# Patient Record
Sex: Male | Born: 1990 | Race: Black or African American | Hispanic: No | Marital: Single | Smoking: Never smoker
Health system: Southern US, Community
[De-identification: ages and names within clinical notes are randomized; demographics above are authoritative.]

---
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
## 2013-06-03 ENCOUNTER — Emergency Department (HOSPITAL_COMMUNITY): Payer: 59

## 2013-06-03 ENCOUNTER — Encounter (HOSPITAL_COMMUNITY): Payer: Self-pay | Admitting: Emergency Medicine

## 2013-06-03 ENCOUNTER — Emergency Department (HOSPITAL_COMMUNITY)
Admission: EM | Admit: 2013-06-03 | Discharge: 2013-06-03 | Disposition: A | Discharge status: Home / Self Care | Payer: 59 | Attending: Emergency Medicine | Admitting: Emergency Medicine

## 2013-06-03 DIAGNOSIS — Y9361 Activity, american tackle football: Secondary | ICD-10-CM | POA: Insufficient documentation

## 2013-06-03 DIAGNOSIS — X500XXA Overexertion from strenuous movement or load, initial encounter: Secondary | ICD-10-CM | POA: Insufficient documentation

## 2013-06-03 DIAGNOSIS — Y92838 Other recreation area as the place of occurrence of the external cause: Secondary | ICD-10-CM

## 2013-06-03 DIAGNOSIS — S82409A Unspecified fracture of shaft of unspecified fibula, initial encounter for closed fracture: Secondary | ICD-10-CM | POA: Insufficient documentation

## 2013-06-03 DIAGNOSIS — Y9239 Other specified sports and athletic area as the place of occurrence of the external cause: Secondary | ICD-10-CM | POA: Insufficient documentation

## 2013-06-03 DIAGNOSIS — S82402A Unspecified fracture of shaft of left fibula, initial encounter for closed fracture: Secondary | ICD-10-CM

## 2013-06-03 MED ORDER — OXYCODONE-ACETAMINOPHEN 5-325 MG PO TABS
1.0000 | ORAL_TABLET | Freq: Once | ORAL | Status: AC
  Administered 2013-06-03: 1 via ORAL
  Filled 2013-06-03: qty 1

## 2013-06-03 MED ORDER — HYDROCODONE-ACETAMINOPHEN 5-325 MG PO TABS: 1.0000 | ORAL_TABLET | ORAL | Status: DC | PRN

## 2013-06-03 NOTE — ED Notes (Signed)
Patient reports he was playing tackle football and he was hit from behind. Patient reports, "My friend stated my knee went one way and my ankle went another." Patient states he is unable to walk on that left foot.

## 2013-06-03 NOTE — ED Provider Notes (Signed)
Medical screening examination/treatment/procedure(s) were performed by non-physician practitioner and as supervising physician I was immediately available for consultation/collaboration.   EKG Interpretation None         David H Yao, MD 06/03/13 2313 

## 2013-06-03 NOTE — ED Provider Notes (Signed)
CSN: [id number]     Arrival date & time 06/03/13  1906 History  This chart was scribed for non-physician practitioner working Johnnette Gourd, [place] with Richardean Canal, MD by Elveria Rising, ED Scribe. This patient was seen in room TR06C/TR06C and the patient's care was started at 9:35 PM.   Chief Complaint  Patient presents with  . Ankle Injury      The history is provided by the patient. No language interpreter was used.   HPI Comments: Calvin Leonard is a 23 y.o. male who presents to the Emergency Department complaining of a suspected left ankle sprain. Patient reports being tackled while playing football today and rolling his ankle. Patient currently rates pain at 10/10. States he is unable to stand or ambulate without pain. Patient has not taken any medication since the incident. Patient wears ankle braces and reports previous ankle injury.  History reviewed. No pertinent past medical history. History reviewed. No pertinent past surgical history. No family history on file. History  Substance Use Topics  . Smoking status: Never Smoker   . Smokeless tobacco: Not on file  . Alcohol Use: No    Review of Systems A complete 10 system review of systems was obtained and all systems are negative except as noted in the HPI and PMH.     Allergies  Review of patient's allergies indicates no known allergies.  Home Medications   Current Outpatient Rx  Name  Route  Sig  Dispense  Refill  . HYDROcodone-acetaminophen (NORCO/VICODIN) 5-325 MG per tablet   Oral   Take 1-2 tablets by mouth every 4 (four) hours as needed.   10 tablet   0    Triage Vitals: BP 126/57  Pulse 98  Temp(Src) 99.3 F (37.4 C) (Oral)  Resp 18  Ht 6\' 3"  (1.905 m)  Wt 210 lb (95.255 kg)  BMI 26.25 kg/m2  SpO2 99%  Physical Exam  Nursing note and vitals reviewed. Constitutional: He is oriented to person, place, and time. He appears well-developed and well-nourished. No distress.  HENT:  Head: Normocephalic and  atraumatic.  Eyes: Conjunctivae and EOM are normal.  Neck: Normal range of motion. Neck supple.  Cardiovascular: Normal rate, regular rhythm and normal heart sounds.   Pulmonary/Chest: Effort normal and breath sounds normal.  Musculoskeletal: Normal range of motion. He exhibits tenderness. He exhibits no edema.  Left ankle: Swelling and tenderness to lateral malleolus. Tenderness to talofibular ligament.  Achilles tendon intact.  +2 DP and TP pulses.   Neurological: He is alert and oriented to person, place, and time.  Skin: Skin is warm and dry.  Psychiatric: He has a normal mood and affect. His behavior is normal.    ED Course  Procedures (including critical care time) DIAGNOSTIC STUDIES: Oxygen Saturation is 99% on room air, normal by my interpretation.    COORDINATION OF CARE: 7:20 PM- Will order X-ray of left foot. Pt advised of plan for treatment and pt agrees.    Labs Review Labs Reviewed - No data to display Imaging Review Dg Ankle Complete Left  06/03/2013   CLINICAL DATA:  The bowel related injury, ankle swelling  EXAM: LEFT ANKLE COMPLETE - 3+ VIEW  COMPARISON:  None.  FINDINGS: There is an oblique nondisplaced fracture through the distal shaft of the fibula. Mortise is intact.  IMPRESSION: Fibula fracture   Electronically Signed   By: Esperanza Heir M.D.   On: 06/03/2013 21:07      EKG Interpretation None  MDM   Final diagnoses:  Left fibular fracture    Neurovascularly intact. Patient in no apparent distress. Posterior splint applied. Followup with ortho. Pain meds given. Stable for discharge. Return precautions given. Patient states understanding of treatment care plan and is agreeable.   I personally performed the services described in this documentation, which was scribed in my presence. The recorded information has been reviewed and is accurate.    Trevor MaceRobyn M Albert, PA-C 06/03/13 2136

## 2013-06-03 NOTE — Progress Notes (Signed)
Orthopedic Tech Progress Note Patient Details:  Calvin DeisDevon Leonard 12-28-1990 [id number]  Ortho Devices Type of Ortho Device: Ace wrap;Post (short leg) splint;Crutches Ortho Device/Splint Location: lle Ortho Device/Splint Interventions: Application   Pragya Lofaso 06/03/2013, 9:43 PM

## 2013-06-03 NOTE — Discharge Instructions (Signed)
Take Vicodin for severe pain only. No driving or operating heavy machinery while taking vicodin. This medication may cause drowsiness.  Fibular Fracture, Ankle, Adult, Undisplaced, Treated With Immobilization A simple fracture of the bone below the knee on the outside of your leg (fibula) usually heals without problems. CAUSES Typically, a fibular fracture occurs as a result of trauma. A blow to the side of your leg or a powerful twisting movement can cause a fracture. Fibular fractures are often seen as a result of football, soccer, or skiing injuries. SYMPTOMS Symptoms of a fibular fracture can include:  Pain.  Shortening or abnormal alignment of your lower leg (angulation). DIAGNOSIS A health care provider will need to examine the leg. X-ray exams will be ordered for further to confirm the fracture and evaluate the extent and of the injury. TREATMENT  Typically, a cast or immobilizer is applied. Sometimes a splint is placed on these fractures if it is needed for comfort or if the bones are badly out of place. Crutches may be needed to help you get around.  HOME CARE INSTRUCTIONS   Apply ice to the injured area:  Put ice in a plastic bag.  Place a towel between your skin and the bag.  Leave the ice on for 20 minutes, 2 3 times a day.  Use crutches as directed. Resume walking without crutches as directed by your health care provider or when comfortable doing so.  Only take over-the-counter or prescription medicines for pain, discomfort, or fever as directed by your health care provider.  Keeping your leg raised may lessen swelling.  If you have a removable splint or boot, do not remove the boot unless directed by your health care provider.  Do not not drive a car or operate a motor vehicle until your health care provider specifically tells you it is safe to do so. SEEK IMMEDIATE MEDICAL CARE IF:   Your cast gets damaged or breaks.  You have continued severe pain or more  swelling than you did before the cast was put on, or the pain is not controlled with medications.  Your skin or nails below the injury turn blue or grey, or feel cold or numb.  There is a bad smell or pus coming from under the cast.  You develop severe pain in ankle or foot. MAKE SURE YOU:   Understand these instructions.  Will watch your condition.  Will get help right away if you are not doing well or get worse. Document Released: 11/01/2001 Document Revised: 11/30/2012 Document Reviewed: 09/21/2012 [facility] Patient Information 2014 [place].

## 2013-06-03 NOTE — ED Notes (Signed)
Ortho paged. 

## 2015-02-27 ENCOUNTER — Telehealth: Payer: Self-pay | Admitting: Family Medicine

## 2015-02-27 NOTE — Telephone Encounter (Signed)
Pt used to see Dr. Tawanna Coolerodd back in 2012. Father called wondering if Dr. Tawanna Coolerodd would continue seeing him again. Dad, Calvin Leonard, is a regular patient of Dr. Nelida Meuseodd's. Please advise.

## 2015-02-28 NOTE — Telephone Encounter (Signed)
Dr Tawanna Coolerodd would like for patient to schedule with a different provider

## 2015-03-04 NOTE — Telephone Encounter (Signed)
Pt dad will have his son callback

## 2015-05-08 ENCOUNTER — Ambulatory Visit (INDEPENDENT_AMBULATORY_CARE_PROVIDER_SITE_OTHER): Payer: 59 | Admitting: Adult Health

## 2015-05-08 ENCOUNTER — Encounter: Payer: Self-pay | Admitting: Adult Health

## 2015-05-08 ENCOUNTER — Other Ambulatory Visit (HOSPITAL_COMMUNITY)
Admission: RE | Admit: 2015-05-08 | Discharge: 2015-05-08 | Disposition: A | Discharge status: Home / Self Care | Payer: 59 | Source: Ambulatory Visit | Attending: Adult Health | Admitting: Adult Health

## 2015-05-08 VITALS — BP 120/84 | Temp 97.9°F | Ht 75.0 in | Wt 222.9 lb

## 2015-05-08 DIAGNOSIS — M791 Myalgia: Secondary | ICD-10-CM | POA: Diagnosis not present

## 2015-05-08 DIAGNOSIS — Z113 Encounter for screening for infections with a predominantly sexual mode of transmission: Secondary | ICD-10-CM | POA: Diagnosis not present

## 2015-05-08 DIAGNOSIS — Z0001 Encounter for general adult medical examination with abnormal findings: Secondary | ICD-10-CM

## 2015-05-08 DIAGNOSIS — Z0000 Encounter for general adult medical examination without abnormal findings: Secondary | ICD-10-CM | POA: Diagnosis not present

## 2015-05-08 DIAGNOSIS — M7918 Myalgia, other site: Secondary | ICD-10-CM

## 2015-05-08 LAB — CBC WITH DIFFERENTIAL/PLATELET
BASOS ABS: 0 10*3/uL (ref 0.0–0.1)
Basophils Relative: 0.5 % (ref 0.0–3.0)
EOS ABS: 0.2 10*3/uL (ref 0.0–0.7)
Eosinophils Relative: 2.4 % (ref 0.0–5.0)
HEMATOCRIT: 38.5 % — AB (ref 39.0–52.0)
Hemoglobin: 13.4 g/dL (ref 13.0–17.0)
LYMPHS PCT: 41.4 % (ref 12.0–46.0)
Lymphs Abs: 3.1 10*3/uL (ref 0.7–4.0)
MCHC: 34.8 g/dL (ref 30.0–36.0)
MCV: 90.8 fl (ref 78.0–100.0)
MONOS PCT: 8.7 % (ref 3.0–12.0)
Monocytes Absolute: 0.6 10*3/uL (ref 0.1–1.0)
Neutro Abs: 3.5 10*3/uL (ref 1.4–7.7)
Neutrophils Relative %: 47 % (ref 43.0–77.0)
Platelets: 242 10*3/uL (ref 150.0–400.0)
RBC: 4.24 Mil/uL (ref 4.22–5.81)
RDW: 12 % (ref 11.5–15.5)
WBC: 7.4 10*3/uL (ref 4.0–10.5)

## 2015-05-08 LAB — LIPID PANEL
CHOL/HDL RATIO: 3
Cholesterol: 130 mg/dL (ref 0–200)
HDL: 38.7 mg/dL — AB (ref >39.00)
LDL CALC: 72 mg/dL (ref 0–99)
NONHDL: 90.8
Triglycerides: 96 mg/dL (ref 0.0–149.0)
VLDL: 19.2 mg/dL (ref 0.0–40.0)

## 2015-05-08 LAB — BASIC METABOLIC PANEL
BUN: 19 mg/dL (ref 6–23)
CALCIUM: 9.4 mg/dL (ref 8.4–10.5)
CO2: 28 mEq/L (ref 19–32)
Chloride: 102 mEq/L (ref 96–112)
Creatinine, Ser: 1.32 mg/dL (ref 0.40–1.50)
GFR: 86.54 mL/min (ref >60.00)
Glucose, Bld: 90 mg/dL (ref 70–99)
Potassium: 3.7 mEq/L (ref 3.5–5.1)
SODIUM: 138 meq/L (ref 135–145)

## 2015-05-08 LAB — TSH: TSH: 3.85 u[IU]/mL (ref 0.35–4.50)

## 2015-05-08 LAB — HEPATIC FUNCTION PANEL
ALK PHOS: 72 U/L (ref 39–117)
ALT: 20 U/L (ref 0–53)
AST: 36 U/L (ref 0–37)
Albumin: 4.3 g/dL (ref 3.5–5.2)
BILIRUBIN DIRECT: 0.3 mg/dL (ref 0.0–0.3)
BILIRUBIN TOTAL: 1.7 mg/dL — AB (ref 0.2–1.2)
TOTAL PROTEIN: 7 g/dL (ref 6.0–8.3)

## 2015-05-08 LAB — HEMOGLOBIN A1C: Hgb A1c MFr Bld: 4.9 % (ref 4.6–6.5)

## 2015-05-08 MED ORDER — CYCLOBENZAPRINE HCL 10 MG PO TABS: 10.0000 mg | ORAL_TABLET | Freq: Three times a day (TID) | ORAL | Status: DC | PRN

## 2015-05-08 NOTE — Progress Notes (Signed)
Patient presents to clinic today to establish care. He is a healthy and active 25 year old AA male. He has no past medical history.   Acute Concerns: Establish Care Left gluteal strain - He has been experiencing pain in his left gluteal muscle for the last 2-3 months. He woke up with the pain and it has never really gone away. He is able to work out and do deep squats. Pain is with stretching and certain twisting moments, like getting out of the car.  He has been icing and using ibuprofen  Chronic Issues: None  Health Maintenance: Dental -- Not currently seeing [facility] -- Not currently seeing. Immunizations -- UTD Diet: Eats healthy  Exercise: Works out 4-5 days a week.  History reviewed. No pertinent past medical history.  History reviewed. No pertinent past surgical history.  No current outpatient prescriptions on file prior to visit.   No current facility-administered medications on file prior to visit.    No Known Allergies  Family History  Problem Relation Age of Onset  . Elevated Lipids Father   . Hypertension Father   . Diabetes Father     Social History   Social History  . Marital Status: Single    Spouse Name: N/A  . Number of Children: N/A  . Years of Education: N/A   Occupational History  . Not on file.   Social History Main Topics  . Smoking status: Never Smoker   . Smokeless tobacco: Not on file  . Alcohol Use: No  . Drug Use: No  . Sexual Activity: Yes   Other Topics Concern  . Not on file   Social History Narrative   Bachelors in Sports Medicine.    Not married    No kids   Works at [employer]          Review of Systems  Constitutional: Negative.   HENT: Negative.   Eyes: Negative.   Respiratory: Negative.   Cardiovascular: Negative.   Gastrointestinal: Negative.   Genitourinary: Negative.   Musculoskeletal: Negative.        Pain in left gluteal   Skin: Negative.   Neurological: Negative.   Endo/Heme/Allergies:  Negative.   Psychiatric/Behavioral: Negative.     BP 120/84 mmHg  Temp(Src) 97.9 F (36.6 C) (Oral)  Ht  (1.905 m)  Wt 222 lb 14.4 oz (101.107 kg)  BMI 27.86 kg/m2  Physical Exam  Constitutional: He is oriented to person, place, and time and well-developed, well-nourished, and in no distress. No distress.  HENT:  Head: Normocephalic and atraumatic.  Right Ear: External ear normal.  Left Ear: External ear normal.  Nose: Nose normal.  Mouth/Throat: Oropharynx is clear and moist. No oropharyngeal exudate.  Eyes: Conjunctivae and EOM are normal. Pupils are equal, round, and reactive to light. Right eye exhibits no discharge. Left eye exhibits no discharge. No scleral icterus.  Neck: Normal range of motion. Neck supple. No JVD present. No tracheal deviation present. No thyromegaly present.  Cardiovascular: Normal rate, regular rhythm, normal heart sounds and intact distal pulses.  Exam reveals no gallop and no friction rub.   No murmur heard. Pulmonary/Chest: Effort normal and breath sounds normal. No respiratory distress. He has no wheezes. He has no rales. He exhibits no tenderness.  Abdominal: Soft. Bowel sounds are normal. He exhibits no distension and no mass. There is no tenderness. There is no rebound and no guarding.  Musculoskeletal: Normal range of motion. He exhibits no edema or tenderness.  Able to bend over but it causes discomfort. Raising leg straight off table causes pain.   Lymphadenopathy:    He has no cervical adenopathy.  Neurological: He is alert and oriented to person, place, and time. Gait normal. GCS score is 15.  Skin: Skin is warm and dry. No rash noted. He is not diaphoretic. No erythema. No pallor.  Psychiatric: Mood, memory, affect and judgment normal.  Nursing note and vitals reviewed.   Assessment/Plan:   1. Routine general medical examination at a health care facility - Basic metabolic panel - CBC with Differential/Platelet - Hemoglobin A1c -  Hepatic function panel - Lipid panel - TSH - Acute Hep Panel & Hep B Surface Ab - HIV antibody - HSV(herpes smplx)abs-1+2(IgG+IgM)-bld - RPR - Urine cytology ancillary only  2. Gluteal pain - Likely muscle strain or tendonitis. Doubt torn muscle.  - cyclobenzaprine (FLEXERIL) 10 MG tablet; Take 1 tablet (10 mg total) by mouth 3 (three) times daily as needed for muscle spasms.  Dispense: 30 tablet; Refill: 0 - TENS unit - Continue with ice and Ibuprofen -  Consider sports medicine follow up

## 2015-05-08 NOTE — Patient Instructions (Addendum)
It was great meeting you today!  I will follow up with you about your blood work.   Use the Flexeril as we discussed and let me know in a week how you are feeling.   If you need anything in the meantime,please let me know.   Health Maintenance, Male A healthy lifestyle and preventative care can promote health and wellness.  Maintain regular health, dental, and eye exams.  Eat a healthy diet. Foods like vegetables, fruits, whole grains, low-fat dairy products, and lean protein foods contain the nutrients you need and are low in calories. Decrease your intake of foods high in solid fats, added sugars, and salt. Get information about a proper diet from your health care provider, if necessary.  Regular physical exercise is one of the most important things you can do for your health. Most adults should get at least 150 minutes of moderate-intensity exercise (any activity that increases your heart rate and causes you to sweat) each week. In addition, most adults need muscle-strengthening exercises on 2 or more days a week.   Maintain a healthy weight. The body mass index (BMI) is a screening tool to identify possible weight problems. It provides an estimate of body fat based on height and weight. Your health care provider can find your BMI and can help you achieve or maintain a healthy weight. For males 20 years and older:  A BMI below 18.5 is considered underweight.  A BMI of 18.5 to 24.9 is normal.  A BMI of 25 to 29.9 is considered overweight.  A BMI of 30 and above is considered obese.  Maintain normal blood lipids and cholesterol by exercising and minimizing your intake of saturated fat. Eat a balanced diet with plenty of fruits and vegetables. Blood tests for lipids and cholesterol should begin at age 72 and be repeated every 5 years. If your lipid or cholesterol levels are high, you are over age 65, or you are at high risk for heart disease, you may need your cholesterol levels checked  more frequently.Ongoing high lipid and cholesterol levels should be treated with medicines if diet and exercise are not working.  If you smoke, find out from your health care provider how to quit. If you do not use tobacco, do not start.  Lung cancer screening is recommended for adults aged 55-80 years who are at high risk for developing lung cancer because of a history of smoking. A yearly low-dose CT scan of the lungs is recommended for people who have at least a 30-pack-year history of smoking and are current smokers or have quit within the past 15 years. A pack year of smoking is smoking an average of 1 pack of cigarettes a day for 1 year (for example, a 30-pack-year history of smoking could mean smoking 1 pack a day for 30 years or 2 packs a day for 15 years). Yearly screening should continue until the smoker has stopped smoking for at least 15 years. Yearly screening should be stopped for people who develop a health problem that would prevent them from having lung cancer treatment.  If you choose to drink alcohol, do not have more than 2 drinks per day. One drink is considered to be 12 oz (360 mL) of beer, 5 oz (150 mL) of wine, or 1.5 oz (45 mL) of liquor.  Avoid the use of street drugs. Do not share needles with anyone. Ask for help if you need support or instructions about stopping the use of drugs.  High  blood pressure causes heart disease and increases the risk of stroke. High blood pressure is more likely to develop in:  People who have blood pressure in the end of the normal range (100-139/85-89 mm Hg).  People who are overweight or obese.  People who are African American.  If you are 33-47 years of age, have your blood pressure checked every 3-5 years. If you are 3 years of age or older, have your blood pressure checked every year. You should have your blood pressure measured twice--once when you are at a hospital or clinic, and once when you are not at a hospital or clinic. Record  the average of the two measurements. To check your blood pressure when you are not at a hospital or clinic, you can use:  An automated blood pressure machine at a pharmacy.  A home blood pressure monitor.  If you are 24-18 years old, ask your health care provider if you should take aspirin to prevent heart disease.  Diabetes screening involves taking a blood sample to check your fasting blood sugar level. This should be done once every 3 years after age 14 if you are at a normal weight and without risk factors for diabetes. Testing should be considered at a younger age or be carried out more frequently if you are overweight and have at least 1 risk factor for diabetes.  Colorectal cancer can be detected and often prevented. Most routine colorectal cancer screening begins at the age of 8 and continues through age 43. However, your health care provider may recommend screening at an earlier age if you have risk factors for colon cancer. On a yearly basis, your health care provider may provide home test kits to check for hidden blood in the stool. A small camera at the end of a tube may be used to directly examine the colon (sigmoidoscopy or colonoscopy) to detect the earliest forms of colorectal cancer. Talk to your health care provider about this at age 24 when routine screening begins. A direct exam of the colon should be repeated every 5-10 years through age 76, unless early forms of precancerous polyps or small growths are found.  People who are at an increased risk for hepatitis B should be screened for this virus. You are considered at high risk for hepatitis B if:  You were born in a country where hepatitis B occurs often. Talk with your health care provider about which countries are considered high risk.  Your parents were born in a high-risk country and you have not received a shot to protect against hepatitis B (hepatitis B vaccine).  You have HIV or AIDS.  You use needles to inject  street drugs.  You live with, or have sex with, someone who has hepatitis B.  You are a man who has sex with other men (MSM).  You get hemodialysis treatment.  You take certain medicines for conditions like cancer, organ transplantation, and autoimmune conditions.  Hepatitis C blood testing is recommended for all people born from [birth year] through 1963 and any individual with known risk factors for hepatitis C.  Healthy men should no longer receive prostate-specific antigen (PSA) blood tests as part of routine cancer screening. Talk to your health care provider about prostate cancer screening.  Testicular cancer screening is not recommended for adolescents or adult males who have no symptoms. Screening includes self-exam, a health care provider exam, and other screening tests. Consult with your health care provider about any symptoms you have or any concerns  you have about testicular cancer.  Practice safe sex. Use condoms and avoid high-risk sexual practices to reduce the spread of sexually transmitted infections (STIs).  You should be screened for STIs, including gonorrhea and chlamydia if:  You are sexually active and are younger than 24 years.  You are older than 24 years, and your health care provider tells you that you are at risk for this type of infection.  Your sexual activity has changed since you were last screened, and you are at an increased risk for chlamydia or gonorrhea. Ask your health care provider if you are at risk.  If you are at risk of being infected with HIV, it is recommended that you take a prescription medicine daily to prevent HIV infection. This is called pre-exposure prophylaxis (PrEP). You are considered at risk if:  You are a man who has sex with other men (MSM).  You are a heterosexual man who is sexually active with multiple partners.  You take drugs by injection.  You are sexually active with a partner who has HIV.  Talk with your health care provider  about whether you are at high risk of being infected with HIV. If you choose to begin PrEP, you should first be tested for HIV. You should then be tested every 3 months for as long as you are taking PrEP.  Use sunscreen. Apply sunscreen liberally and repeatedly throughout the day. You should seek shade when your shadow is shorter than you. Protect yourself by wearing long sleeves, pants, a wide-brimmed hat, and sunglasses year round whenever you are outdoors.  Tell your health care provider of new moles or changes in moles, especially if there is a change in shape or color. Also, tell your health care provider if a mole is larger than the size of a pencil eraser.  A one-time screening for abdominal aortic aneurysm (AAA) and surgical repair of large AAAs by ultrasound is recommended for men aged 65-75 years who are current or former smokers.  Stay current with your vaccines (immunizations).   This information is not intended to replace advice given to you by your health care provider. Make sure you discuss any questions you have with your health care provider.   Document Released: 08/08/2007 Document Revised: 03/02/2014 Document Reviewed: 07/07/2010 Elsevier Interactive Patient Education [employer]2016 Elsevier Inc.

## 2015-05-09 LAB — ACUTE HEP PANEL AND HEP B SURFACE AB
HCV AB: NEGATIVE
HEP B S AG: NEGATIVE
Hep A IgM: NONREACTIVE
Hep B C IgM: NONREACTIVE
Hep B S Ab: NEGATIVE

## 2015-05-09 LAB — HIV ANTIBODY (ROUTINE TESTING W REFLEX): HIV: NONREACTIVE

## 2015-05-09 LAB — RPR

## 2015-05-09 LAB — URINE CYTOLOGY ANCILLARY ONLY
Chlamydia: NEGATIVE
NEISSERIA GONORRHEA: NEGATIVE
Trichomonas: NEGATIVE

## 2015-05-10 LAB — HSV(HERPES SMPLX)ABS-I+II(IGG+IGM)-BLD
HERPES SIMPLEX VRS I-IGM AB (EIA): 1.57 {index} — AB
HSV 1 Glycoprotein G Ab, IgG: <0.9 Index (ref <0.90)

## 2015-05-14 ENCOUNTER — Telehealth: Payer: Self-pay | Admitting: Adult Health

## 2015-05-14 NOTE — Telephone Encounter (Signed)
error 

## 2015-05-15 ENCOUNTER — Ambulatory Visit (INDEPENDENT_AMBULATORY_CARE_PROVIDER_SITE_OTHER): Payer: 59 | Admitting: Adult Health

## 2015-05-15 ENCOUNTER — Encounter: Payer: Self-pay | Admitting: Adult Health

## 2015-05-15 VITALS — BP 126/74 | Temp 97.9°F | Ht 75.0 in | Wt 222.0 lb

## 2015-05-15 DIAGNOSIS — M791 Myalgia: Secondary | ICD-10-CM | POA: Diagnosis not present

## 2015-05-15 DIAGNOSIS — M7918 Myalgia, other site: Secondary | ICD-10-CM

## 2015-05-15 MED ORDER — KETOROLAC TROMETHAMINE 60 MG/2ML IM SOLN
60.0000 mg | Freq: Once | INTRAMUSCULAR | Status: AC
  Administered 2015-05-15: 60 mg via INTRAMUSCULAR

## 2015-05-15 NOTE — Patient Instructions (Addendum)
I am sorry you are going through this still.  Continue to do stretching exercises.   Stop doing leg exercises until you are seen by Sports Medicine.   Use TENS unit. - You can buy one at [employer] pretty cheaply.     Muscle Strain A muscle strain is an injury that occurs when a muscle is stretched beyond its normal length. Usually a small number of muscle fibers are torn when this happens. Muscle strain is rated in degrees. First-degree strains have the least amount of muscle fiber tearing and pain. Second-degree and third-degree strains have increasingly more tearing and pain.  Usually, recovery from muscle strain takes 1-2 weeks. Complete healing takes 5-6 weeks.  CAUSES  Muscle strain happens when a sudden, violent force placed on a muscle stretches it too far. This may occur with lifting, sports, or a fall.  RISK FACTORS Muscle strain is especially common in athletes.  SIGNS AND SYMPTOMS At the site of the muscle strain, there may be:  Pain.  Bruising.  Swelling.  Difficulty using the muscle due to pain or lack of normal function. DIAGNOSIS  Your health care provider will perform a physical exam and ask about your medical history. TREATMENT  Often, the best treatment for a muscle strain is resting, icing, and applying cold compresses to the injured area.  HOME CARE INSTRUCTIONS   Use the PRICE method of treatment to promote muscle healing during the first 2-3 days after your injury. The PRICE method involves:  Protecting the muscle from being injured again.  Restricting your activity and resting the injured body part.  Icing your injury. To do this, put ice in a plastic bag. Place a towel between your skin and the bag. Then, apply the ice and leave it on from 15-20 minutes each hour. After the third day, switch to moist heat packs.  Apply compression to the injured area with a splint or elastic bandage. Be careful not to wrap it too tightly. This may interfere with blood  circulation or increase swelling.  Elevate the injured body part above the level of your heart as often as you can.  Only take over-the-counter or prescription medicines for pain, discomfort, or fever as directed by your health care provider.  Warming up prior to exercise helps to prevent future muscle strains. SEEK MEDICAL CARE IF:   You have increasing pain or swelling in the injured area.  You have numbness, tingling, or a significant loss of strength in the injured area. MAKE SURE YOU:   Understand these instructions.  Will watch your condition.  Will get help right away if you are not doing well or get worse.   This information is not intended to replace advice given to you by your health care provider. Make sure you discuss any questions you have with your health care provider.   Document Released: 02/09/2005 Document Revised: 11/30/2012 Document Reviewed: 09/08/2012 Elsevier Interactive Patient Education [employer]2016 Elsevier Inc.

## 2015-05-15 NOTE — Progress Notes (Signed)
   Subjective:    Patient ID: Calvin Leonard, male    DOB: Apr 05, 1990, 25 y.o.   MRN: [id number]  HPI 25 year old AA male,who returns to the office today for continued gluteal pain L>R.  I last saw him on 05/08/2015 and prescribed him Flexeril, advised stretching exercises and to use ibuprofen as needed. He reports that the Flexeril worked slightly. He stretches before and after working out.   He has not tried a TENS unit.    Review of Systems  Constitutional: Negative.   Respiratory: Negative.   Cardiovascular: Negative.   Musculoskeletal: Positive for myalgias.  Skin: Negative.   All other systems reviewed and are negative.  No past medical history on file.  Social History   Social History  . Marital Status: Single    Spouse Name: N/A  . Number of Children: N/A  . Years of Education: N/A   Occupational History  . Not on file.   Social History Main Topics  . Smoking status: Never Smoker   . Smokeless tobacco: Not on file  . Alcohol Use: No  . Drug Use: No  . Sexual Activity: Yes   Other Topics Concern  . Not on file   Social History Narrative   Bachelors in Sports Medicine.    Not married    No kids   Works at [employer]          No past surgical history on file.  Family History  Problem Relation Age of Onset  . Elevated Lipids Father   . Hypertension Father   . Diabetes Father     No Known Allergies  Current Outpatient Prescriptions on File Prior to Visit  Medication Sig Dispense Refill  . cyclobenzaprine (FLEXERIL) 10 MG tablet Take 1 tablet (10 mg total) by mouth 3 (three) times daily as needed for muscle spasms. 30 tablet 0   No current facility-administered medications on file prior to visit.    BP 126/74 mmHg  Temp(Src) 97.9 F (36.6 C) (Oral)  Ht 6\' 3"  (1.905 m)  Wt 222 lb (100.699 kg)  BMI 27.75 kg/m2       Objective:   Physical Exam  Constitutional: He is oriented to person, place, and time. He appears well-developed and  well-nourished. No distress.  Cardiovascular: Normal rate, regular rhythm, normal heart sounds and intact distal pulses.  Exam reveals no gallop and no friction rub.   No murmur heard. Pulmonary/Chest: Effort normal and breath sounds normal. No respiratory distress. He has no wheezes. He has no rales. He exhibits no tenderness.  Neurological: He is alert and oriented to person, place, and time.  Skin: Skin is warm and dry. No rash noted. He is not diaphoretic. No erythema. No pallor.  Psychiatric: He has a normal mood and affect. His behavior is normal. Judgment and thought content normal.  Nursing note and vitals reviewed.     Assessment & Plan:  1. Gluteal pain - Script written for TENS unit.  - Ambulatory referral to Sports Medicine - ketorolac (TORADOL) injection 60 mg; Inject 2 mLs (60 mg total) into the muscle once. - Stop doing leg exercises until seen by sports medicine.  - Continue with stretching exercises.

## 2015-05-20 ENCOUNTER — Ambulatory Visit (INDEPENDENT_AMBULATORY_CARE_PROVIDER_SITE_OTHER): Payer: 59 | Admitting: Family Medicine

## 2015-05-20 ENCOUNTER — Encounter: Payer: Self-pay | Admitting: Family Medicine

## 2015-05-20 ENCOUNTER — Ambulatory Visit (INDEPENDENT_AMBULATORY_CARE_PROVIDER_SITE_OTHER)
Admission: RE | Admit: 2015-05-20 | Discharge: 2015-05-20 | Disposition: A | Discharge status: Home / Self Care | Payer: 59 | Source: Ambulatory Visit | Attending: Family Medicine | Admitting: Family Medicine

## 2015-05-20 VITALS — BP 116/80 | HR 66 | Wt 221.0 lb

## 2015-05-20 DIAGNOSIS — M629 Disorder of muscle, unspecified: Secondary | ICD-10-CM | POA: Diagnosis not present

## 2015-05-20 DIAGNOSIS — M5442 Lumbago with sciatica, left side: Secondary | ICD-10-CM | POA: Diagnosis not present

## 2015-05-20 DIAGNOSIS — M79604 Pain in right leg: Secondary | ICD-10-CM

## 2015-05-20 DIAGNOSIS — M79605 Pain in left leg: Secondary | ICD-10-CM

## 2015-05-20 DIAGNOSIS — M5441 Lumbago with sciatica, right side: Secondary | ICD-10-CM

## 2015-05-20 DIAGNOSIS — M545 Low back pain, unspecified: Secondary | ICD-10-CM | POA: Insufficient documentation

## 2015-05-20 DIAGNOSIS — M6289 Other specified disorders of muscle: Secondary | ICD-10-CM | POA: Insufficient documentation

## 2015-05-20 MED ORDER — PREDNISONE 50 MG PO TABS: 50.0000 mg | ORAL_TABLET | Freq: Every day | ORAL | Status: DC

## 2015-05-20 NOTE — Progress Notes (Signed)
[name] [address] [place], [place] [address] Phone: [phone] Subjective:    I'm seeing this patient by the request  of:  Shirline Frees, NP  CC: Low back pain  [name] Calvin Leonard is a 25 y.o. male coming in with complaint of low back pain. Patient is had this pain for quite some time but seems to be worsening. Seems to be more on the buttocks area bilaterally. Some mild tightness of the lower back. States that it seems to be worse after sitting a long amount of time or being in a flexed position. Denies any significant radiation down the legs but states that it seems to be worsening. Still able to work out fairly regularly. Patient was taken a week off to see if this would help and unfortunately has not notice any significant improvement. Patient is tried over-the-counter medications with very minimal improvement. Does not remember nature injury. Rates the severity of pain a 6 out of 10. Concerned because it does not seem to be getting better.     No past medical history on file. No history of orthopedic problems No past surgical history on file. Social History   Social History  . Marital Status: Single    Spouse Name: N/A  . Number of Children: N/A  . Years of Education: N/A   Social History Main Topics  . Smoking status: Never Smoker   . Smokeless tobacco: Not on file  . Alcohol Use: No  . Drug Use: No  . Sexual Activity: Yes   Other Topics Concern  . Not on file   Social History Narrative   Bachelors in Sports Medicine.    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 Elevated Lipids Father   . Hypertension Father   . Diabetes Father   No family history of rheumatological diseases  Past medical history, social, surgical and family history all reviewed in electronic medical record.  No pertanent information  unless stated regarding to the chief complaint.   Review of Systems: No headache, visual changes, nausea, vomiting, diarrhea, constipation, dizziness, abdominal pain, skin rash, fevers, chills, night sweats, weight loss, swollen lymph nodes, body aches, joint swelling, muscle aches, chest pain, shortness of breath, mood changes.   Objective Blood pressure 116/80, pulse 66, weight 221 lb (100.245 kg).  General: No apparent distress alert and oriented x3 mood and affect normal, dressed appropriately.  HEENT: Pupils equal, extraocular movements intact  Respiratory: Patient's speak in full sentences and does not appear short of breath  Cardiovascular: No lower extremity edema, non tender, no erythema  Skin: Warm dry intact with no signs of infection or rash on extremities or on axial skeleton.  Abdomen: Soft nontender  Neuro: Cranial nerves II through XII are intact, neurovascularly intact in all extremities with 2+ DTRs and 2+ pulses.  Lymph: No lymphadenopathy of posterior or anterior cervical chain or axillae bilaterally.  Gait normal with good balance and coordination.  MSK:  Non tender with full range of motion and good stability and symmetric strength and tone of shoulders, elbows, wrist, hip, knee and ankles bilaterally.  Back Exam:  Inspection: Unremarkable  Motion: Flexion 45 deg, Extension 15 deg with positive stork test localizing to the right side., Side Bending to 45 deg bilaterally,  Rotation to 45 deg bilaterally  SLR laying: Negative  XSLR laying: Negative  Palpable tenderness: Tenderness of the paraspinal musculature of the lumbar spine L4-L5 on the right side FABER: negative. Sensory change: Gross sensation intact to all lumbar and sacral dermatomes.  Reflexes: 2+ at both patellar tendons, 2+ at achilles tendons, Babinski's downgoing.  Strength at foot  Plantar-flexion: 5/5 Dorsi-flexion: 5/5 Eversion: 5/5 Inversion: 5/5  Leg strength  Quad: 5/5 Hamstring: 5/5 but does have  tightness bilaterally. Hip flexor: 5/5 Hip abductors: 5/5  Gait unremarkable.   Procedure note 97110; 15 minutes spent for Therapeutic exercises as stated in above notes.  This included exercises focusing on stretching, strengthening, with significant focus on eccentric aspects. Pelvic tilt/bracing instruction to focus on control of the pelvic girdle and lower abdominal muscles  Glute strengthening exercises, focusing on proper firing of the glutes without engaging the low back muscles Proper stretching techniques for maximum relief for the hamstrings, hip flexors, low back and some rotation where tolerated  Proper technique shown and discussed handout in great detail with ATC.  All questions were discussed and answered.     Impression and Recommendations:     This case required medical decision making of moderate complexity.      Note: This dictation was prepared with Dragon dictation along with smaller phrase technology. Any transcriptional errors that result from this process are unintentional.

## 2015-05-20 NOTE — Assessment & Plan Note (Signed)
Work with Event organiserathletic trainer today. We discussed icing regimen. We discussed possible compression. Patient is going to focus on the back and if no significant improvement we will advance him accordingly. Also he could be a candidate for osteopathic manipulation if x-rays are normal.

## 2015-05-20 NOTE — Patient Instructions (Signed)
Good to see you  Ice 20 minutes 2 times daily. Usually after activity and before bed. Exercises 3 times a week.  Predniosne daily for 5 days  VItamin D 2000 IU daily  Xray downstairs See me again in 7-10 days  Message for Up front OK to double book new patient slot

## 2015-05-20 NOTE — Assessment & Plan Note (Signed)
Concerned with patient having more pain with extension the patient could've a stress fracture. Possible tightness of the hamstrings could be related to more of a nerve root impingement. Patient given prednisone, muscle relaxer. We discussed icing regimen. We discussed which activities to do in which ones to avoid. Patient will come back in 7-10 days. Depending on how patient is doing we will either consider advancing patient appropriately or formal physical therapy. Any weakness of the lower extremities advancing imaging would be warranted.

## 2015-05-29 ENCOUNTER — Ambulatory Visit (INDEPENDENT_AMBULATORY_CARE_PROVIDER_SITE_OTHER): Payer: 59 | Admitting: Family Medicine

## 2015-05-29 ENCOUNTER — Encounter: Payer: Self-pay | Admitting: Family Medicine

## 2015-05-29 VITALS — BP 124/80 | HR 71 | Ht 75.0 in | Wt 220.0 lb

## 2015-05-29 DIAGNOSIS — M5417 Radiculopathy, lumbosacral region: Secondary | ICD-10-CM

## 2015-05-29 DIAGNOSIS — M5442 Lumbago with sciatica, left side: Secondary | ICD-10-CM

## 2015-05-29 DIAGNOSIS — M5441 Lumbago with sciatica, right side: Secondary | ICD-10-CM

## 2015-05-29 DIAGNOSIS — M5416 Radiculopathy, lumbar region: Secondary | ICD-10-CM

## 2015-05-29 NOTE — Assessment & Plan Note (Signed)
Patient is having more localized pain and seems to be with radicular symptoms going down the left leg. Seems to be worse in a flexed position and does have a positive straight leg test. Patient states that he is also noticing some weakness of the leg and does have 4 out of 5 strength compared to the contralateral side. At this point with the weakness I do want to get advance imaging and an MRI. We need to rule out any type of nerve root impingement or any occult fracture that could be contribute in. Patient has some mild limitation in range of motion of the back as well as on causing spondylolysis is within the differential. Patient will come back after the MRI and depending on findings this could change medical management.  Spent  25 minutes with patient face-to-face and had greater than 50% of counseling including as described above in assessment and plan.

## 2015-05-29 NOTE — Progress Notes (Signed)
Calvin Leonard D.O. [facility] [address] [place], [place] [address] Phone: [phone] Subjective:     CC: Low back pain f/u Calvin Leonard:Subjective Calvin Leonard is a 25 y.o. male coming in with complaint of low back pain. Patient was seen before and did have some lower back pain as well as some tightness of the hamstrings. Patient states that unfortunately it seems to be worsening even with the conservative therapy. Patient states anytime he seems Calvin Leonard is needing has a shooting pain going down the posterior aspect of his left leg. States that also when he is trending do a staining hamstrings stretch and even feels discomfort when he is trying to do pushups. Anytime he is standing or sitting for long amount of time he has more difficulty and it seems to be giving him some weakness when he tries to stand just on that leg. There is also numbness on the left foot that now seems to be constant.     No past medical history on file. No history of orthopedic problems No past surgical history on file. Social History   Social History  . Marital Status: Single    Spouse Name: N/A  . Number of Children: N/A  . Years of Education: N/A   Social History Main Topics  . Smoking status: Never Smoker   . Smokeless tobacco: None  . Alcohol Use: No  . Drug Use: No  . Sexual Activity: Yes   Other Topics Concern  . None   Social History Narrative   Bachelors in Sports Medicine.    Not married    No kids   Works at [employer]         No Known Allergies no known drug allergies Family History  Problem Relation Age of Onset  . Elevated Lipids Father   . Hypertension Father   . Diabetes Father   No family history of rheumatological diseases  Past medical history, social, surgical and family history all reviewed in electronic medical record.  No pertanent information unless stated regarding to the chief complaint.   Review of Systems: No headache, visual changes, nausea,  vomiting, diarrhea, constipation, dizziness, abdominal pain, skin rash, fevers, chills, night sweats, weight loss, swollen lymph nodes, body aches, joint swelling, muscle aches, chest pain, shortness of breath, mood changes.   Objective Blood pressure 124/80, pulse 71, height 6\' 3"  (1.905 m), weight 220 lb (99.791 kg), SpO2 97 %.  General: No apparent distress alert and oriented x3 mood and affect normal, dressed appropriately.  HEENT: Pupils equal, extraocular movements intact  Respiratory: Patient's speak in full sentences and does not appear short of breath  Cardiovascular: No lower extremity edema, non tender, no erythema  Skin: Warm dry intact with no signs of infection or rash on extremities or on axial skeleton.  Abdomen: Soft nontender  Neuro: Cranial nerves II through XII are intact, neurovascularly intact in all extremities with 2+ DTRs and 2+ pulses.  Lymph: No lymphadenopathy of posterior or anterior cervical chain or axillae bilaterally.  Gait normal with good balance and coordination.  MSK:  Non tender with full range of motion and good stability and symmetric strength and tone of shoulders, elbows, wrist, hip, knee and ankles bilaterally.  Back Exam:  Inspection: Unremarkable  Motion: Flexion 45 deg, Extension 15 deg with positive stork test localizing to the left side., Side Bending to 45 deg bilaterally,  Rotation to 45 deg bilaterally  SLR laying positive left side XSLR laying: Negative  Palpable  tenderness: Increasing tenderness in the L4-L5 and S1 areas on the left side. FABER: negative. Sensory change: Gross sensation intact to all lumbar and sacral dermatomes.  Reflexes: 2+ at both patellar tendons, 2+ at achilles tendons, Babinski's downgoing.  Strength at foot  Plantar-flexion: 5/5 Dorsi-flexion: 5/5 Eversion: 5/5 Inversion: 5/5  Leg strength  Patient's left leg is 4 out of 5 strength of the quadriceps and hamstring compared to 5 out of 5 strength on the  contralateral side. Mild 4+ out of 5 strength on the plantar flexion of the left foot compared to the right foot. Gait unremarkable.      Impression and Recommendations:     This case required medical decision making of moderate complexity.      Note: This dictation was prepared with Dragon dictation along with smaller phrase technology. Any transcriptional errors that result from this process are unintentional.

## 2015-05-29 NOTE — Progress Notes (Signed)
Pre visit review using our clinic review tool, if applicable. No additional management support is needed unless otherwise documented below in the visit note. 

## 2015-05-29 NOTE — Patient Instructions (Signed)
Good to see you  Sorry you are still hurting.  Lets try a MRI of your back with the symptoms you are having I want to se eyou again 1-2 days after and wqe will go over it If normal then we will need to consider labs and I will try a injection on the hamstrings.  For now, only bike until MRI is done.

## 2015-06-05 ENCOUNTER — Ambulatory Visit
Admission: RE | Admit: 2015-06-05 | Discharge: 2015-06-05 | Disposition: A | Discharge status: Home / Self Care | Payer: 59 | Source: Ambulatory Visit | Attending: Family Medicine | Admitting: Family Medicine

## 2015-06-05 ENCOUNTER — Other Ambulatory Visit: Payer: Self-pay

## 2015-06-05 ENCOUNTER — Telehealth: Payer: Self-pay

## 2015-06-05 DIAGNOSIS — M5442 Lumbago with sciatica, left side: Principal | ICD-10-CM

## 2015-06-05 DIAGNOSIS — M5441 Lumbago with sciatica, right side: Secondary | ICD-10-CM

## 2015-06-05 DIAGNOSIS — M5126 Other intervertebral disc displacement, lumbar region: Secondary | ICD-10-CM | POA: Diagnosis not present

## 2015-06-05 NOTE — Telephone Encounter (Signed)
Spoke with patient. Discussed MRI results and Dr. Michaelle CopasSmith's recommendation of trying an epidural. Patient agrees to proceed with the epidural, orders will be placed.

## 2015-06-17 ENCOUNTER — Ambulatory Visit
Admission: RE | Admit: 2015-06-17 | Discharge: 2015-06-17 | Disposition: A | Discharge status: Home / Self Care | Payer: 59 | Source: Ambulatory Visit | Attending: Family Medicine | Admitting: Family Medicine

## 2015-06-17 DIAGNOSIS — M5136 Other intervertebral disc degeneration, lumbar region: Secondary | ICD-10-CM | POA: Diagnosis not present

## 2015-06-17 DIAGNOSIS — M5442 Lumbago with sciatica, left side: Principal | ICD-10-CM

## 2015-06-17 DIAGNOSIS — M5441 Lumbago with sciatica, right side: Secondary | ICD-10-CM

## 2015-06-17 MED ORDER — METHYLPREDNISOLONE ACETATE 40 MG/ML INJ SUSP (RADIOLOG
120.0000 mg | Freq: Once | INTRAMUSCULAR | Status: AC
  Administered 2015-06-17: 120 mg via EPIDURAL

## 2015-06-17 MED ORDER — IOHEXOL 180 MG/ML  SOLN
1.0000 mL | Freq: Once | INTRAMUSCULAR | Status: AC | PRN
  Administered 2015-06-17: 1 mL via EPIDURAL

## 2015-06-17 NOTE — Discharge Instructions (Signed)

## 2015-06-27 ENCOUNTER — Ambulatory Visit (INDEPENDENT_AMBULATORY_CARE_PROVIDER_SITE_OTHER): Payer: 59 | Admitting: Family Medicine

## 2015-06-27 ENCOUNTER — Encounter: Payer: Self-pay | Admitting: Family Medicine

## 2015-06-27 VITALS — BP 122/82 | HR 72 | Wt 218.0 lb

## 2015-06-27 DIAGNOSIS — M5417 Radiculopathy, lumbosacral region: Secondary | ICD-10-CM

## 2015-06-27 DIAGNOSIS — M5416 Radiculopathy, lumbar region: Secondary | ICD-10-CM

## 2015-06-27 DIAGNOSIS — M5126 Other intervertebral disc displacement, lumbar region: Secondary | ICD-10-CM | POA: Diagnosis not present

## 2015-06-27 NOTE — Progress Notes (Signed)
[name] [address] [place], [place] [address] Phone: [phone] Subjective:     CC: Low back pain f/u [id number] Calvin Leonard is a 25 y.o. male coming in with complaint of low back pain.patient was having worsening symptoms with a positive straight leg test. Patient was sent for an MRI. MRI was apparently visualized by me and showed broad base disc herniation compressing both S1 nerve roots Patient was given an epidural steroid injection. States that he was 100% improved had 3 days. Now seems to be getting some tightness again. Has been working out regularly. Denies though as much tightness of the hamstrings as he is having previously. States that the back does seem looser as well. Continues to do the exercises fairly regularly. Not taking any medications at this time.    No past medical history on file. No history of orthopedic problems No past surgical history on file. Social History   Social History  . Marital Status: Single    Spouse Name: N/A  . Number of Children: N/A  . Years of Education: N/A   Social History Main Topics  . Smoking status: Never Smoker   . Smokeless tobacco: None  . Alcohol Use: No  . Drug Use: No  . Sexual Activity: Yes   Other Topics Concern  . None   Social History Narrative   Bachelors in Sports Medicine.    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 Elevated Lipids Father   . Hypertension Father   . Diabetes Father   No family history of rheumatological diseases  Past medical history, social, surgical and family history all reviewed in electronic medical record.  No pertanent information unless stated regarding to the chief complaint.   Review of Systems: No headache, visual changes, nausea, vomiting, diarrhea, constipation, dizziness, abdominal pain, skin rash, fevers, chills, night  sweats, weight loss, swollen lymph nodes, body aches, joint swelling, muscle aches, chest pain, shortness of breath, mood changes.   Objective Blood pressure 122/82, pulse 72, weight 218 lb (98.884 kg).  General: No apparent distress alert and oriented x3 mood and affect normal, dressed appropriately.  HEENT: Pupils equal, extraocular movements intact  Respiratory: Patient's speak in full sentences and does not appear short of breath  Cardiovascular: No lower extremity edema, non tender, no erythema  Skin: Warm dry intact with no signs of infection or rash on extremities or on axial skeleton.  Abdomen: Soft nontender  Neuro: Cranial nerves II through XII are intact, neurovascularly intact in all extremities with 2+ DTRs and 2+ pulses.  Lymph: No lymphadenopathy of posterior or anterior cervical chain or axillae bilaterally.  Gait normal with good balance and coordination.  MSK:  Non tender with full range of motion and good stability and symmetric strength and tone of shoulders, elbows, wrist, hip, knee and ankles bilaterally.  Back Exam:  Inspection: Unremarkable  Motion: Flexion 45 deg, Extension 25 deg., Side Bending to 45 deg bilaterally,  Rotation to 45 deg bilaterally  SLR negative XSLR laying: Negative  Palpable tenderness: minimal tenderness over the paraspinal musculature of the lumbar spine FABER: negative. Sensory change: Gross sensation intact to all lumbar and sacral dermatomes.  Reflexes: 2+ at both patellar tendons, 2+ at achilles tendons, Babinski's downgoing.  Strength at foot  Plantar-flexion: 5/5 Dorsi-flexion: 5/5 Eversion:  5/5 Inversion: 5/5  Leg strength  5 out of 5 and symmetric which is a improvement.  Gait unremarkable.      Impression and Recommendations:     This case required medical decision making of moderate complexity.      Note: This dictation was prepared with Dragon dictation along with smaller phrase technology. Any transcriptional errors  that result from this process are unintentional.

## 2015-06-27 NOTE — Assessment & Plan Note (Signed)
Patient responded well to the injection. Tolerated the procedure well. We discussed icing regimen and home exercises. Patient was given an exercise prescription today and will be sent to formal physical therapy. Discuss continuing to monitor for any type of symptoms in which red flags and when to seek medical attention. Follow-up again in 4-6 weeks.  Spent  25 minutes with patient face-to-face and had greater than 50% of counseling including as described above in assessment and plan.

## 2015-06-27 NOTE — Patient Instructions (Signed)
I am glad you are doing better Ice is still good  Physical therapy will be calling you  Stay active but be careful with heavy lifting.  I would start 50% weight and increase 10% a week See me again in 4-6 weeks to make sure you are doing better and if not then we will consider one more injection.

## 2015-07-02 ENCOUNTER — Ambulatory Visit: Payer: 59 | Attending: Family Medicine | Admitting: Physical Therapy

## 2015-07-02 ENCOUNTER — Encounter: Payer: Self-pay | Admitting: Physical Therapy

## 2015-07-02 DIAGNOSIS — M545 Low back pain: Secondary | ICD-10-CM

## 2015-07-02 DIAGNOSIS — M25552 Pain in left hip: Secondary | ICD-10-CM

## 2015-07-02 NOTE — Therapy (Signed)
[facility] [address] [place], [place], [address] Phone: [phone]   Fax:  [phone]  Physical Therapy Evaluation  Patient Details  Name: Calvin Leonard MRN: [id number] Date of Birth: 1990-11-15 Referring Provider: Antoine PrimasZachary Smith, DO  Encounter Date: 07/02/2015      PT End of Session - 07/02/15 0932    Visit Number 1   Number of Visits 9   Date for PT Re-Evaluation 08/02/15   Authorization Type [facility] employee   PT Start Time 0930   PT Stop Time 1017   PT Time Calculation (min) 47 min      History reviewed. No pertinent past medical history.  History reviewed. No pertinent past surgical history.  There were no vitals filed for this visit.       Subjective Assessment - 07/02/15 0933    Subjective approx 3 months began feeling back pain, suspected from working out. At first thought it was a L leg injury. Currently working out at gym.    Limitations Sitting   How long can you sit comfortably? depending on the day 5-10 min or hours   How long can you stand comfortably? no issues   Diagnostic tests MRI    Patient Stated Goals able to sit without pain   Currently in Pain? Yes   Pain Score 3    Pain Location Back   Pain Orientation Left   Pain Descriptors / Indicators Aching   Pain Type Acute pain   Pain Radiating Towards L buttock   Pain Onset 1 to 4 weeks ago   Pain Frequency Intermittent   Aggravating Factors  being seated   Pain Relieving Factors standing and moving around   Multiple Pain Sites Yes   Pain Score 8   Pain Location Buttocks   Pain Orientation Left   Pain Descriptors / Indicators Aching   Pain Type Acute pain   Pain Radiating Towards LLE   Pain Onset 1 to 4 weeks ago   Pain Frequency Intermittent   Aggravating Factors  sitting   Pain Relieving Factors being active            [facility] PT Assessment - 07/02/15 0001    Assessment   Medical Diagnosis L5-S1 disk herniation   Referring  Provider Antoine PrimasZachary Smith, DO   Onset Date/Surgical Date 06/12/15  approximately   Hand Dominance Right   Next MD Visit none at this time   Prior Therapy no   Precautions   Precautions None   Restrictions   Weight Bearing Restrictions No   Balance Screen   Has the patient fallen in the past 6 months No   Prior Function   Vocation Full time employment   Vocation Requirements cleaning pt rooms, trash pick up   Observation/Other Assessments   Focus on Therapeutic Outcomes (FOTO)  25% disability   ROM / Strength   AROM / PROM / Strength AROM;Strength   AROM   AROM Assessment Site Lumbar   Lumbar Flexion 110   Lumbar Extension 30   Strength   Strength Assessment Site Hip   Right/Left Hip Right;Left   Right Hip Flexion 4/5   Right Hip Extension 5/5   Left Hip Flexion 4/5   Left Hip Extension 5/5                   [facility] Adult PT Treatment/Exercise - 07/02/15 0001    Self-Care   Self-Care Other Self-Care Comments   Other Self-Care Comments  use of  gym equipment with proper posture   Exercises   Exercises Lumbar   Lumbar Exercises: Supine   Ab Set 10 reps;5 seconds   Lumbar Exercises: Quadruped   Opposite Arm/Leg Raise 10 reps;3 seconds   Modalities   Modalities Moist Heat   Moist Heat Therapy   Number Minutes Moist Heat 10 Minutes   Moist Heat Location Lumbar Spine  in prone                PT Education - 07/02/15 1314    Education provided Yes   Education Details anatomy of condition, POC, HEP, proper posture with gym equipment, squat form, core contraction, MRI review.    Person(s) Educated Patient   Methods Explanation;Demonstration;Tactile cues;Verbal cues   Comprehension Verbalized understanding;Returned demonstration;Verbal cues required;Tactile cues required;Need further instruction             PT Long Term Goals - 07/02/15 1326    PT LONG TERM GOAL #1   Title resolution of radicular pain in seated position. by 6/9   Time 4   Period  Weeks   Status New   PT LONG TERM GOAL #2   Title FOTO to 84% ability.    Time 4   Period Weeks   Status New   PT LONG TERM GOAL #3   Title able to perform independent exercise program and work duties without LBP or radicular pain   Time 4   Period Weeks   Status New   PT LONG TERM GOAL #4   Title hip flexor MMT 5/5 without compensation from adductors.    Time 4   Period Weeks   Status New               Plan - 07/02/15 1319    Clinical Impression Statement Pt c/o signs and symptoms consistent with disk herniation resulting in radicular pain into L buttock. Pt symptoms are position-dependent, improved with standing and extension and worsened with bending and slouching in seated. Notable hypertrophy in lumbar paraspinals with difficulty appropriately contracting core. Discussed gym posture and holding weight in front of him rather than on shoulders while squatting. pt will benefit from skilled PT in order to improve posture and awareness while in seated and during work and gym activities.  Pt will also benefit from coordination training of gluts and core to decrease overuse of adductor musculature.    Rehab Potential Good   PT Frequency 2x / week   PT Duration 4 weeks   PT Treatment/Interventions ADLs/Self Care Home Management;Cryotherapy;Electrical Stimulation;Traction;Ultrasound;Moist Heat;Iontophoresis /ml Dexamethasone;Gait training;Stair training;Functional mobility training;Therapeutic activities;Therapeutic exercise;Balance training;Patient/family education;Neuromuscular re-education;Manual techniques;Taping;Dry needling;Passive range of motion   PT Next Visit Plan traction if continued radicular pain, postural extensor training, core and glut coordination   PT Home Exercise Plan transverse abdominis, bird dog   Consulted and Agree with Plan of Care Patient      Patient will benefit from skilled therapeutic intervention in order to improve the following deficits and  impairments:  Pain, Improper body mechanics, Increased muscle spasms, Decreased coordination  Visit Diagnosis: Left low back pain, with sciatica presence unspecified - Plan: PT plan of care cert/re-cert  Pain in left hip - Plan: PT plan of care cert/re-cert     Problem List Patient Active Problem List   Diagnosis Date Noted  . 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 Low back pain 05/20/2015  . Hamstring tightness 05/20/2015  . Preventative health care 09/11/2010  . LEG PAIN,  BILATERAL 04/11/2009  . HIP PAIN, LEFT 12/27/2007    Shaunita Seney C. Malasha Kleppe PT, DPT 07/02/2015 1:34 PM   [facility] [address] [place], [place], [address] Phone: [phone]   Fax:  [phone]  Name: Calvin Leonard MRN: [id number] Date of Birth: Feb 03, 1991

## 2015-07-10 ENCOUNTER — Ambulatory Visit: Payer: 59 | Admitting: Physical Therapy

## 2015-07-10 ENCOUNTER — Encounter: Payer: Self-pay | Admitting: Physical Therapy

## 2015-07-10 DIAGNOSIS — M545 Low back pain: Secondary | ICD-10-CM | POA: Diagnosis not present

## 2015-07-10 DIAGNOSIS — M25552 Pain in left hip: Secondary | ICD-10-CM | POA: Diagnosis not present

## 2015-07-10 NOTE — Therapy (Signed)
[facility] [address] [place], [place], [address] Phone: [phone]   Fax:  [phone]  Physical Therapy Treatment  Patient Details  Name: Calvin Leonard MRN: [id number] Date of Birth: 1990-06-05 Referring Provider: Antoine Primas, DO  Encounter Date: 07/10/2015      PT End of Session - 07/10/15 1238    Visit Number 2   Number of Visits 9   Date for PT Re-Evaluation 08/02/15   Authorization Type [facility] employee   PT Start Time 1146   PT Stop Time 1235   PT Time Calculation (min) 49 min   Activity Tolerance Patient tolerated treatment well   Behavior During Therapy [facility] for tasks assessed/performed      History reviewed. No pertinent past medical history.  History reviewed. No pertinent past surgical history.  There were no vitals filed for this visit.      Subjective Assessment - 07/10/15 1233    Subjective Pt reports he has had significant improvements in pain. Is able to stand from a chair without radicular pain. Continues to have sharp incidence of pain occasionally but is less constant.    Currently in Pain? Yes   Pain Score 3    Pain Location Back   Pain Orientation Lower   Pain Descriptors / Indicators Aching;Tightness   Pain Type Acute pain                         [facility] Adult PT Treatment/Exercise - 07/10/15 0001    Lumbar Exercises: Stretches   Active Hamstring Stretch Other (comment)  15 ea   Lumbar Exercises: Standing   Other Standing Lumbar Exercises kneeling chop 5lb x20 ea   Other Standing Lumbar Exercises knee driver with eccentric lower x10 ea   Lumbar Exercises: Seated   Sit to Stand Limitations x10 eccentric   Lumbar Exercises: Prone   Other Prone Lumbar Exercises prone press ups x15   Other Prone Lumbar Exercises swimmers 10x each   Moist Heat Therapy   Number Minutes Moist Heat 10 Minutes   Moist Heat Location Lumbar Spine  prone   Manual Therapy   Manual therapy  comments IASTM L lumbar paraspinals and QL                PT Education - 07/10/15 1237    Education provided Yes   Education Details exercise form/rationale, extensions during workouts as precautionary measure.    Person(s) Educated Patient   Methods Explanation;Demonstration;Tactile cues;Verbal cues   Comprehension Verbalized understanding;Returned demonstration;Verbal cues required;Tactile cues required;Need further instruction             PT Long Term Goals - 07/02/15 1326    PT LONG TERM GOAL #1   Title resolution of radicular pain in seated position. by 6/9   Time 4   Period Weeks   Status New   PT LONG TERM GOAL #2   Title FOTO to 84% ability.    Time 4   Period Weeks   Status New   PT LONG TERM GOAL #3   Title able to perform independent exercise program and work duties without LBP or radicular pain   Time 4   Period Weeks   Status New   PT LONG TERM GOAL #4   Title hip flexor MMT 5/5 without compensation from adductors.    Time 4   Period Weeks   Status New  Plan - 07/10/15 1239    Clinical Impression Statement Challenged eccentric control of lower extremities today as well as extension stabilization endurance today. Denied radicular pain until beginning active hamstring stretch followed by bridges wehre he felt pain in his lower buttock; resolved with neural glides in supine and prone press ups.    PT Treatment/Interventions ADLs/Self Care Home Management;Cryotherapy;Electrical Stimulation;Traction;Ultrasound;Moist Heat;Iontophoresis 4mg /ml Dexamethasone;Gait training;Stair training;Functional mobility training;Therapeutic activities;Therapeutic exercise;Balance training;Patient/family education;Neuromuscular re-education;Manual techniques;Taping;Dry needling;Passive range of motion   PT Next Visit Plan traction if continued radicular pain, postural extensor training, core and glut coordination   PT Home Exercise Plan transverse  abdominis, bird dog   Consulted and Agree with Plan of Care Patient      Patient will benefit from skilled therapeutic intervention in order to improve the following deficits and impairments:  Pain, Improper body mechanics, Increased muscle spasms, Decreased coordination  Visit Diagnosis: Left low back pain, with sciatica presence unspecified  Pain in left hip     Problem List Patient Active Problem List   Diagnosis Date Noted  . 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 Low back pain 05/20/2015  . Hamstring tightness 05/20/2015  . Preventative health care 09/11/2010  . LEG PAIN, BILATERAL 04/11/2009  . HIP PAIN, LEFT 12/27/2007   Cliffton Spradley C. Aahil Fredin PT, DPT 07/10/2015 12:43 PM   [facility] [address] [place], [place], [address] Phone: [phone]   Fax:  [phone]  Name: Calvin Leonard MRN: [id number] Date of Birth: 04/10/1990

## 2015-07-11 ENCOUNTER — Ambulatory Visit: Payer: 59 | Admitting: Physical Therapy

## 2015-07-11 ENCOUNTER — Encounter: Payer: Self-pay | Admitting: Physical Therapy

## 2015-07-11 DIAGNOSIS — M545 Low back pain: Secondary | ICD-10-CM

## 2015-07-11 DIAGNOSIS — M25552 Pain in left hip: Secondary | ICD-10-CM | POA: Diagnosis not present

## 2015-07-11 NOTE — Therapy (Addendum)
[place], [place], [address] Phone: [phone]   Fax:  [phone]  Physical Therapy Treatment/Discharge  Patient Details  Name: Calvin Leonard MRN: [id number] Date of Birth: April 23, 1990 Referring Provider: Hulan Saas, DO  Encounter Date: 07/11/2015      PT End of Session - 07/11/15 1059    Visit Number 3   Number of Visits 9   Date for PT Re-Evaluation 08/02/15   Authorization Type [facility] employee   PT Start Time 1100   PT Stop Time 6811   PT Time Calculation (min) 49 min      History reviewed. No pertinent past medical history.  History reviewed. No pertinent past surgical history.  There were no vitals filed for this visit.      Subjective Assessment - 07/11/15 1102    Subjective Denies pain following last visit. Planning to try running today. Mild tightness in lower back.    Limitations Sitting   How long can you sit comfortably? unlimited.    Currently in Pain? No/denies                         [facility] Adult PT Treatment/Exercise - 07/11/15 0001    Lumbar Exercises: Stretches   Active Hamstring Stretch Other (comment)  15 ea   Passive Hamstring Stretch Limitations slant board stretch 2x30s   Quad Stretch Limitations prone 2x30s ea   ITB Stretch Limitations 30s ea   Lumbar Exercises: Standing   Other Standing Lumbar Exercises kneeling chop GTB, high kneeling GHJ ext.   Other Standing Lumbar Exercises knee driver with eccentric lower x15 ea   Lumbar Exercises: Supine   Other Supine Lumbar Exercises hooklying SLR with slight hip IR   Lumbar Exercises: Prone   Opposite Arm/Leg Raise 10 reps;5 seconds   Other Prone Lumbar Exercises high planks with arm lift   Lumbar Exercises: Quadruped   Straight Leg Raises Limitations hip extension with knee flexed to 90                PT Education - 07/11/15 1239    Education provided Yes   Education Details exercise  form/rationale, pilates.    Person(s) Educated Patient   Methods Explanation;Demonstration;Tactile cues;Verbal cues   Comprehension Verbalized understanding;Returned demonstration;Verbal cues required;Tactile cues required;Need further instruction             PT Long Term Goals - 07/02/15 1326    PT LONG TERM GOAL #1   Title resolution of radicular pain in seated position. by 6/9   Time 4   Period Weeks   Status New   PT LONG TERM GOAL #2   Title FOTO to 84% ability.    Time 4   Period Weeks   Status New   PT LONG TERM GOAL #3   Title able to perform independent exercise program and work duties without LBP or radicular pain   Time 4   Period Weeks   Status New   PT LONG TERM GOAL #4   Title hip flexor MMT 5/5 without compensation from adductors.    Time 4   Period Weeks   Status New               Plan - 07/11/15 1240    Clinical Impression Statement Difficulty with core control in rotational motions today. unable to remain balanced in plank when removing one of 4 BOS.    PT Next Visit Plan reformer with  Jen if schedule allows   PT Home Exercise Plan transverse abdominis, bird dog   Consulted and Agree with Plan of Care Patient      Patient will benefit from skilled therapeutic intervention in order to improve the following deficits and impairments:  Pain, Improper body mechanics, Increased muscle spasms, Decreased coordination  Visit Diagnosis: Pain in left hip  Left low back pain, with sciatica presence unspecified     Problem List Patient Active Problem List   Diagnosis Date Noted  . 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 Low back pain 05/20/2015  . Hamstring tightness 05/20/2015  . Preventative health care 09/11/2010  . LEG PAIN, BILATERAL 04/11/2009  . HIP PAIN, LEFT 12/27/2007   PHYSICAL THERAPY DISCHARGE SUMMARY  Visits from Start of Care: 3 Current functional level related to goals / functional  outcomes: See above   Remaining deficits: See above   Education / Equipment: Gym program, HEP, exercise form/rationale  Plan: Patient agrees to discharge.  Patient goals were not met. Patient is being discharged due to the patient's request.  ?????        Pt called to request discharge from PT, unable to measure goal progress but pt is pleased with progress.    Ermalinda Joubert C. Evonna Stoltz PT, DPT 07/11/2015 12:47 PM   [facility] [address] [place], [place], [address] Phone: [phone]   Fax:  [phone]  Name: Calvin Leonard MRN: [id number] Date of Birth: 1990-10-07

## 2015-07-16 ENCOUNTER — Ambulatory Visit: Payer: 59 | Admitting: Physical Therapy

## 2015-07-18 ENCOUNTER — Ambulatory Visit: Payer: 59 | Admitting: Physical Therapy

## 2015-07-23 ENCOUNTER — Encounter: Payer: 59 | Admitting: Physical Therapy

## 2015-07-25 ENCOUNTER — Encounter: Payer: 59 | Admitting: Physical Therapy

## 2015-07-30 ENCOUNTER — Encounter: Payer: 59 | Admitting: Physical Therapy

## 2015-08-01 ENCOUNTER — Encounter: Payer: 59 | Admitting: Physical Therapy

## 2017-03-21 IMAGING — DX DG LUMBAR SPINE COMPLETE 4+V
5 series · 5 of 5 positions shown · non-contrast
Comparison: None.

CLINICAL DATA: Low back pain, bilateral leg pain intermittent for 2
months. No known injury.

EXAM:
LUMBAR SPINE - COMPLETE 4+ VIEW

[l-spine ap]
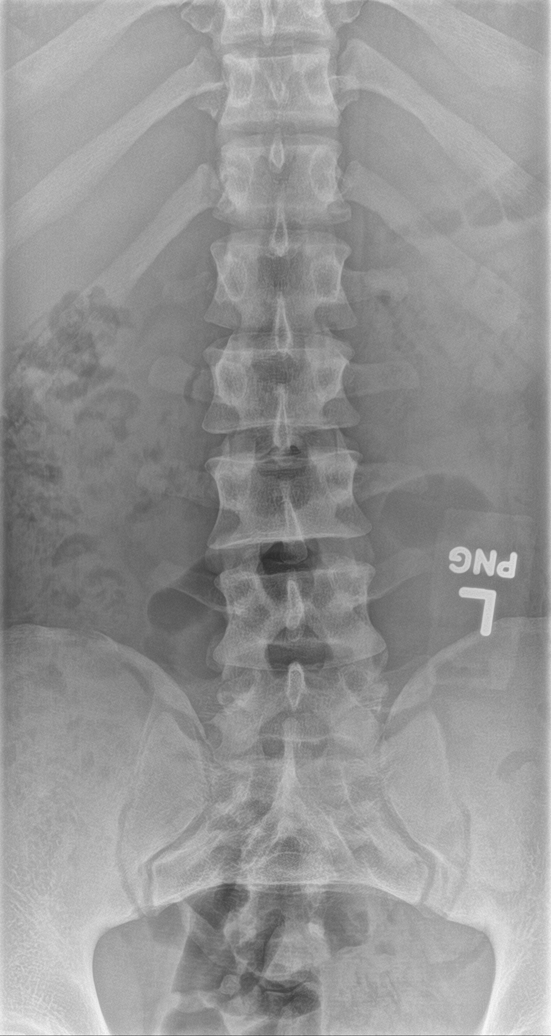

[l-spine obl (1 of 2)]
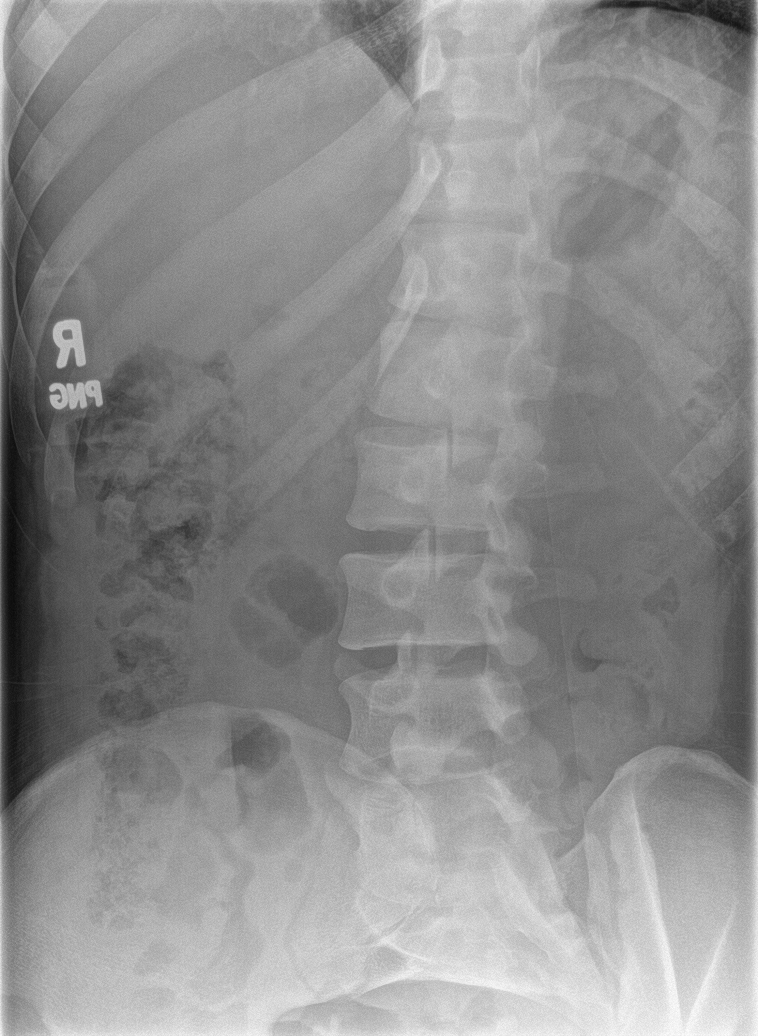

[l-spine obl (2 of 2)]
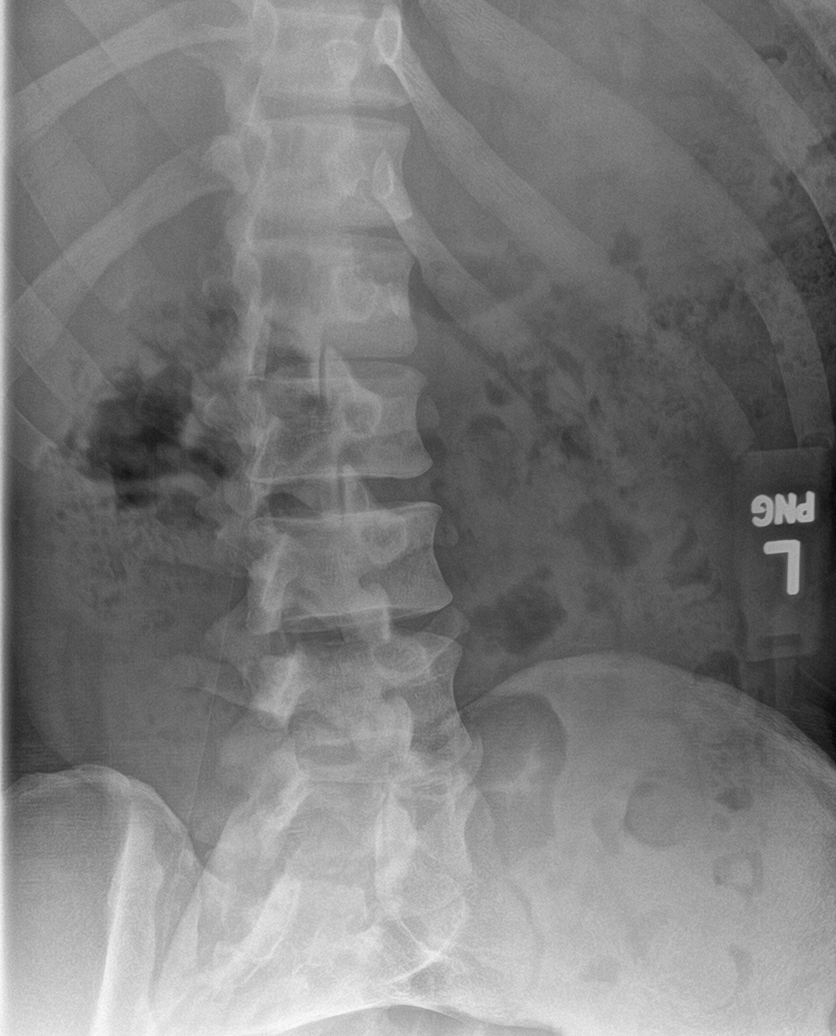

[l-spine lat]
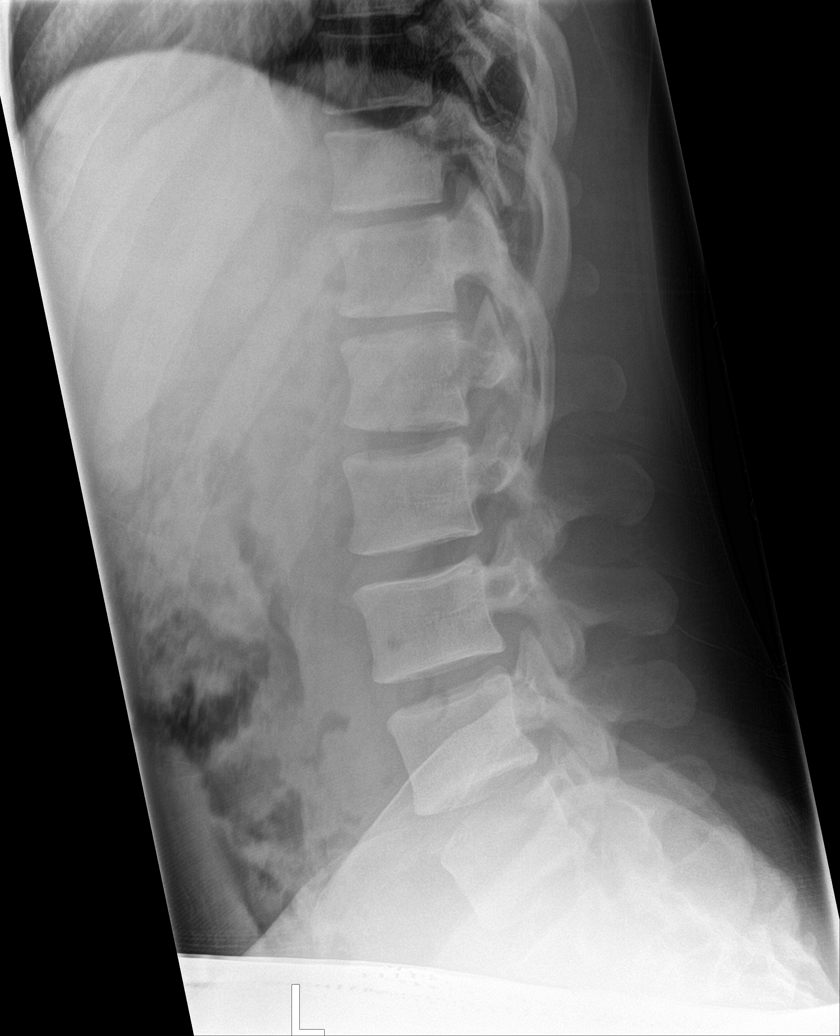

[l-spine spot]
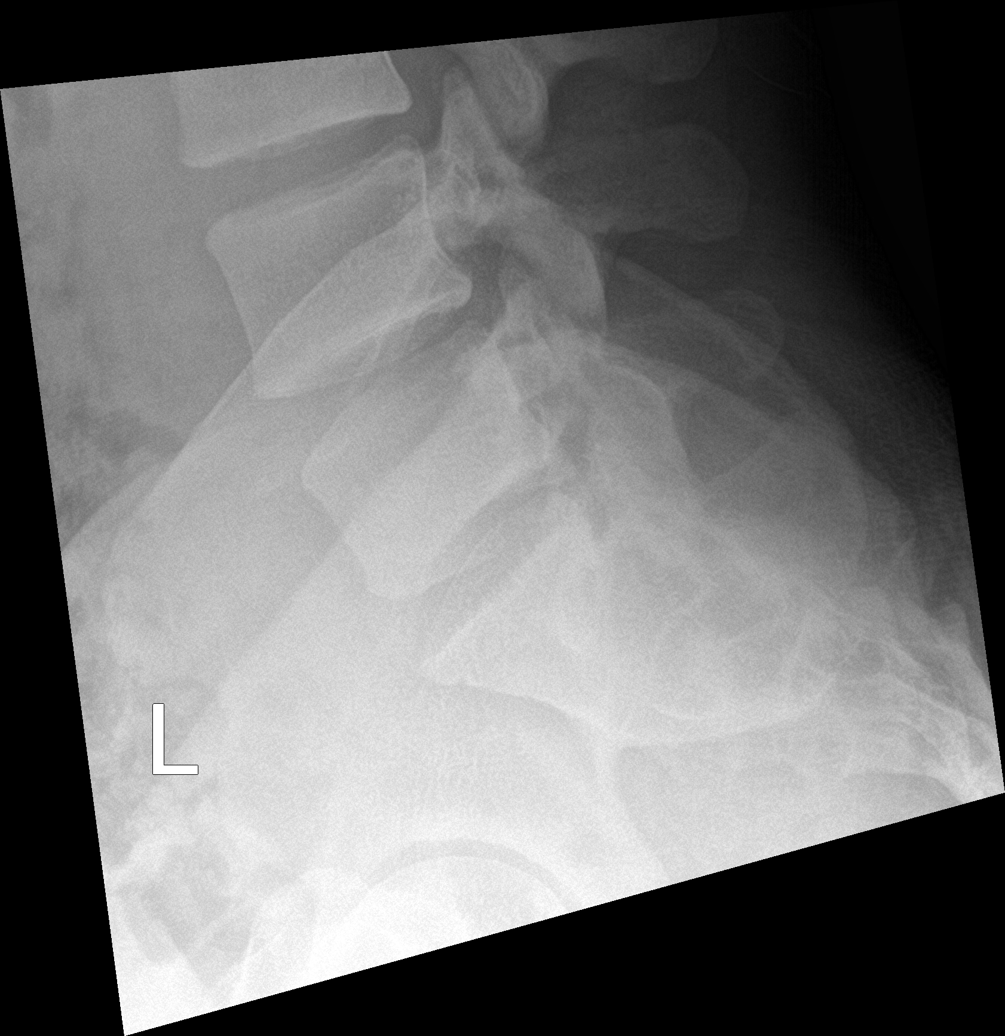

[5 of 5 positions shown; findings below may reference images not displayed]

FINDINGS: There is no evidence of lumbar spine fracture. Alignment is normal.
Intervertebral disc spaces are maintained.
IMPRESSION: Negative.

## 2018-12-15 ENCOUNTER — Telehealth: Payer: Self-pay

## 2018-12-15 NOTE — Telephone Encounter (Signed)
Copied from [place] [id number]. Topic: Appointment Scheduling - Scheduling Inquiry for Clinic >> Dec 15, 2018 11:14 AM Lennox Solders wrote: Reason for CRM: pt last had cpe in 2017.pt would like a cpe before dec 2020

## 2018-12-15 NOTE — Telephone Encounter (Signed)
Please advise. There is no availability until January 2021. Can the patient be worked in?

## 2018-12-16 NOTE — Telephone Encounter (Signed)
At this time there is no availability.

## 2018-12-29 ENCOUNTER — Telehealth: Payer: Self-pay | Admitting: Adult Health

## 2018-12-29 NOTE — Telephone Encounter (Signed)
Patient is needing Dr Tommi Rumps to fill out FMLA paperwrk . Please advise

## 2018-12-29 NOTE — Telephone Encounter (Signed)
Spoke to [place].  He is requesting Tommi Rumps fill out FMLA forms for depression and anxiety.  Advised that he will need an office visit to be evaluated for FMLA.  Pt agreed and scheduled.  Nothing further needed.

## 2019-01-05 ENCOUNTER — Encounter: Payer: Self-pay | Admitting: Adult Health

## 2019-01-05 ENCOUNTER — Other Ambulatory Visit: Payer: Self-pay

## 2019-01-05 ENCOUNTER — Encounter: Payer: Self-pay | Admitting: Family Medicine

## 2019-01-05 ENCOUNTER — Telehealth (INDEPENDENT_AMBULATORY_CARE_PROVIDER_SITE_OTHER): Payer: 59 | Admitting: Adult Health

## 2019-01-05 DIAGNOSIS — F32A Depression, unspecified: Secondary | ICD-10-CM

## 2019-01-05 DIAGNOSIS — F419 Anxiety disorder, unspecified: Secondary | ICD-10-CM

## 2019-01-05 DIAGNOSIS — F329 Major depressive disorder, single episode, unspecified: Secondary | ICD-10-CM | POA: Diagnosis not present

## 2019-01-05 MED ORDER — SERTRALINE HCL 25 MG PO TABS: 25.0000 mg | ORAL_TABLET | Freq: Every day | ORAL | 1 refills | Status: DC

## 2019-01-05 NOTE — Progress Notes (Signed)
Virtual Visit via Video Note  I connected with Calvin Leonard on 01/05/19 at  1:00 PM EST by a video enabled telemedicine application and verified that I am speaking with the correct person using two identifiers.  Location patient: home Location provider:work or home office Persons participating in the virtual visit: patient, provider  I discussed the limitations of evaluation and management by telemedicine and the availability of in person appointments. The patient expressed understanding and agreed to proceed.   HPI: 28 year old male who is being evaluated today for anxiety and depression.  He was last seen in March 2017 . He reports that he has been suffering some form of anxiety and depression since last 2022/07/02 after his father had passed away.  As he gets closer to the holidays he reports that his depression and anxiety have been increasing.  He also relates stress at work to increased anxiety and depression.  He does not feel as though he is enjoying the life that he once did prior to his father passing.  Does denies suicidal ideation   He is currently on FMLA and has not worked since October.  He is wondering if I can fill out his FMLA paperwork for him.  He is interested in starting medication to help with his anxiety and depression.  ROS: See pertinent positives and negatives per HPI.  No past medical history on file.  No past surgical history on file.  Family History  Problem Relation Age of Onset  . Elevated Lipids Father   . Hypertension Father   . Diabetes Father      Current Outpatient Medications:  .  cyclobenzaprine (FLEXERIL) 10 MG tablet, TAKE 1 TABLET (10 MG TOTAL) BY MOUTH 3 (THREE) TIMES DAILY AS NEEDED FOR MUSCLE SPASMS., Disp: , Rfl: 0  EXAM:  VITALS per patient if applicable:  GENERAL: alert, oriented, appears well and in no acute distress  HEENT: atraumatic, conjunttiva clear, no obvious abnormalities on inspection of external nose and ears  NECK:  normal movements of the head and neck  LUNGS: on inspection no signs of respiratory distress, breathing rate appears normal, no obvious gross SOB, gasping or wheezing  CV: no obvious cyanosis  MS: moves all visible extremities without noticeable abnormality  PSYCH/NEURO: pleasant and cooperative, no obvious depression or anxiety, speech and thought processing grossly intact  ASSESSMENT AND PLAN:    Discussed the following assessment and plan:  1. Anxiety and depression -He was advised that I am unable to fill out his FMLA paperwork.  Will refer to behavioral health and started on Zoloft 25 mg.  Reviewed side effects of this medication.  He was advised if he develops suicidal ideation then he needs to go to the emergency room right away.  We will follow-up in 3 weeks - sertraline (ZOLOFT) 25 MG tablet; Take 1 tablet (25 mg total) by mouth daily.  Dispense: 30 tablet; Refill: 1 - Ambulatory referral to Psychiatry  Dorothyann Peng, NP    I discussed the assessment and treatment plan with the patient. The patient was provided an opportunity to ask questions and all were answered. The patient agreed with the plan and demonstrated an understanding of the instructions.   The patient was advised to call back or seek an in-person evaluation if the symptoms worsen or if the condition fails to improve as anticipated.   Dorothyann Peng, NP

## 2019-01-06 NOTE — Telephone Encounter (Signed)
Patient picked up letter and I gave him a brochure for [facility] so he will have the number to reschedule his appointment.

## 2019-01-11 ENCOUNTER — Ambulatory Visit (INDEPENDENT_AMBULATORY_CARE_PROVIDER_SITE_OTHER): Payer: Self-pay | Admitting: Licensed Clinical Social Worker

## 2019-01-11 ENCOUNTER — Encounter (HOSPITAL_COMMUNITY): Payer: Self-pay | Admitting: Licensed Clinical Social Worker

## 2019-01-11 ENCOUNTER — Other Ambulatory Visit: Payer: Self-pay

## 2019-01-11 DIAGNOSIS — F321 Major depressive disorder, single episode, moderate: Secondary | ICD-10-CM

## 2019-01-11 DIAGNOSIS — F411 Generalized anxiety disorder: Secondary | ICD-10-CM

## 2019-01-11 DIAGNOSIS — F4321 Adjustment disorder with depressed mood: Secondary | ICD-10-CM

## 2019-01-11 NOTE — Progress Notes (Signed)
Virtual Visit via Video Note  I connected with Calvin Leonard on 01/11/19 at 12:30 PM EST by a video enabled telemedicine application and verified that I am speaking with the correct person using two identifiers.    I discussed the limitations of evaluation and management by telemedicine and the availability of in person appointments. The patient expressed understanding and agreed to proceed.     I discussed the assessment and treatment plan with the patient. The patient was provided an opportunity to ask questions and all were answered. The patient agreed with the plan and demonstrated an understanding of the instructions.   The patient was advised to call back or seek an in-person evaluation if the symptoms worsen or if the condition fails to improve as anticipated.  I provided 55 minutes of non-face-to-face time during this encounter.   Veneda Melter, LCSW   Comprehensive Clinical Assessment (CCA) Note  01/11/2019 Calvin Leonard [id number]  Visit Diagnosis:      ICD-10-CM   1. Grief  F43.21   2. GAD (generalized anxiety disorder)  F41.1   3. Current moderate episode of major depressive disorder without prior episode (HCC)  F32.1       CCA Part One  Part One has been completed on paper by the patient.  (See scanned document in Chart Review)  CCA Part Two A  Intake/Chief Complaint:  CCA Intake With Chief Complaint CCA Part Two Date: 01/11/19 CCA Part Two Time: 1230 Chief Complaint/Presenting Problem: work related depression and anxiety. A lot going on a work, short staffed, working hard, Insurance account manager not helping get them the help they need. Out of work since 10/22 using FMLA. Father passed away 2 weeks before birthday. Patients Currently Reported Symptoms/Problems: jittery, panicky at work, does not feel like being bothered by anyone, feels like he can't function. EAP and PCP are helping. Taking Zoloft. Collateral Involvement: girlfriend, her kids, mom, brother,  girlfriend's mom. Good support system. Individual's Strengths: family oriented, tries not to let stress get to me that much, outgoing, tries not to overthing and stay positive Individual's Preferences: watching movies, playing video games, working out, spending time with family, writing poetry Individual's Abilities: not at the moment Type of Services Patient Feels Are Needed: individual therapy Initial Clinical Notes/Concerns: depression and anxiety  Mental Health Symptoms Depression:  Depression: Sleep (too much or little), Increase/decrease in appetite, Irritability, Tearfulness, Hopelessness(overwhelmed at work, hopeless that management will make changes)  Mania:  Mania: N/A  Anxiety:   Anxiety: Difficulty concentrating, Irritability, Worrying  Psychosis:  Psychosis: N/A  Trauma:  Trauma: N/A  Obsessions:  Obsessions: N/A  Compulsions:  Compulsions: N/A  Inattention:  Inattention: N/A  Hyperactivity/Impulsivity:  Hyperactivity/Impulsivity: N/A  Oppositional/Defiant Behaviors:  Oppositional/Defiant Behaviors: Argumentative  Borderline Personality:  Emotional Irregularity: Intense/inappropriate anger  Other Mood/Personality Symptoms:      Mental Status Exam Appearance and self-care  Stature:  Stature: Tall  Weight:  Weight: Average weight  Clothing:  Clothing: Neat/clean  Grooming:  Grooming: Normal  Cosmetic use:  Cosmetic Use: None  Posture/gait:  Posture/Gait: Normal  Motor activity:  Motor Activity: Not Remarkable  Sensorium  Attention:  Attention: Normal  Concentration:  Concentration: Normal  Orientation:  Orientation: X5  Recall/memory:  Recall/Memory: Normal  Affect and Mood  Affect:  Affect: Appropriate  Mood:  Mood: Euthymic  Relating  Eye contact:  Eye Contact: Normal  Facial expression:  Facial Expression: Responsive  Attitude toward examiner:  Attitude Toward Examiner: Cooperative  Thought and Language  Speech flow:  Speech Flow: Normal  Thought content:   Thought Content: Appropriate to mood and circumstances  Preoccupation:   NA  Hallucinations:   NA  Organization:   logical  Transport planner of Knowledge:  Fund of Knowledge: Average  Intelligence:  Intelligence: Average  Abstraction:  Abstraction: Normal  Judgement:  Judgement: Normal  Reality Testing:  Reality Testing: Realistic  Insight:  Insight: Good  Decision Making:  Decision Making: Normal  Social Functioning  Social Maturity:  Social Maturity: Responsible  Social Judgement:  Social Judgement: Normal  Stress  Stressors:  Stressors: Brewing technologist, Transitions, Work  Coping Ability:  Coping Ability: English as a second language teacher Deficits:   NA  Supports:   family   Family and Psychosocial History: Family history Marital status: Single(in relationship for about 6 months) Are you sexually active?: Yes What is your sexual orientation?: heterosexual Has your sexual activity been affected by drugs, alcohol, medication, or emotional stress?: no Does patient have children?: Yes How many children?: 1 How is patient's relationship with their children?: 1 child due in May  Childhood History:  Childhood History By whom was/is the patient raised?: Both parents Additional childhood history information: pretty good childhood Description of patient's relationship with caregiver when they were a child: good relationship with parents Patient's description of current relationship with people who raised him/her: Dad passed away this year in Jun 25, 2022, good with mom How were you disciplined when you got in trouble as a child/adolescent?: talked to me, problem solving Does patient have siblings?: Yes Number of Siblings: 2 Description of patient's current relationship with siblings: older brother and older sister. 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 How long has patient been employed?: 2 years in Feb Patient's job has been impacted by current illness: Yes Describe how patient's job has been impacted: depression and anxiety due to work issues, not enough support What is the longest time patient has a held a job?: 4.5 years Where was the patient employed at that time?: [employer] Did You Receive Any Psychiatric Treatment/Services While in the [employer]?: No Are There Guns or Other Weapons in [place]?: No  Education: Education Did Teacher, adult education From [employer]?: Yes Did Physicist, medical?: Yes What Type of College Degree Do you Have?: Bachelors Did Heritage manager?: No What Was Your Major?: Sports Science Did You Have Any Special Interests In School?: personal training Did You Have An Individualized Education Program (IIEP): No Did You Have Any Difficulty At [employer]?: No  Religion: Religion/Spirituality Are You A Religious Person?: Yes What is Your Religious Affiliation?: Christian How Might This Affect Treatment?: it won't  Leisure/Recreation: Leisure / Recreation Leisure and Hobbies: bowling, Manufacturing systems engineer, football or basketball, gym, video games, spend time with family, travel  Exercise/Diet: Exercise/Diet Do You Exercise?: Yes What Type of Exercise Do You Do?: Weight Training, Run/Walk How Many Times a Week Do You Exercise?: 4-5 times a week Have You Gained or Lost A Significant Amount of Weight in the Past Six Months?: No  Do You Follow a Special Diet?: No Do You Have Any Trouble Sleeping?:  No  CCA Part Two C  Alcohol/Drug Use: Alcohol / Drug Use Pain Medications: see MAR Prescriptions: see MAR Over the Counter: see MAR History of alcohol / drug use?: No history of alcohol / drug abuse                      CCA Part Three  ASAM's:  Six Dimensions of Multidimensional Assessment  Dimension 1:  Acute Intoxication and/or Withdrawal Potential:     Dimension 2:  Biomedical Conditions and Complications:     Dimension 3:  Emotional, Behavioral, or Cognitive Conditions and Complications:     Dimension 4:  Readiness to Change:     Dimension 5:  Relapse, Continued use, or Continued Problem Potential:     Dimension 6:  Recovery/Living Environment:      Substance use Disorder (SUD)    Social Function:  Social Functioning Social Maturity: Responsible Social Judgement: Normal  Stress:  Stress Stressors: Grief/losses, Transitions, Work Coping Ability: Overwhelmed Patient Takes Medications The Way The Doctor Instructed?: Yes Priority Risk: Low Acuity  Risk Assessment- Self-Harm Potential: Risk Assessment For Self-Harm Potential Thoughts of Self-Harm: No current thoughts Method: No plan Availability of Means: No access/NA  Risk Assessment -Dangerous to Others Potential: Risk Assessment For Dangerous to Others Potential Method: No Plan Availability of Means: No access or NA Intent: Vague intent or NA Notification Required: No need or identified person  DSM5 Diagnoses: Patient Active Problem List   Diagnosis Date Noted  . 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 Low back pain 05/20/2015  . Hamstring tightness 05/20/2015  . Preventative health care 09/11/2010  . LEG PAIN, BILATERAL 04/11/2009  . HIP PAIN, LEFT 12/27/2007    Patient Centered Plan: Patient is on the following Treatment Plan(s):  Anxiety and Depression  Recommendations for Services/Supports/Treatments: Recommendations for  Services/Supports/Treatments Recommendations For Services/Supports/Treatments: Individual Therapy  Treatment Plan Summary: OP Treatment Plan Summary: "not letting myself get so depressed and anxious about work and life, learn how to take a breather and not get so worked up"  Referrals to Alternative Service(s): Referred to Alternative Service(s):   Place:   Date:   Time:    Referred to Alternative Service(s):   Place:   Date:   Time:    Referred to Alternative Service(s):   Place:   Date:   Time:    Referred to Alternative Service(s):   Place:   Date:   Time:     Veneda MelterJessica R Hope Holst, LCSW

## 2019-02-01 ENCOUNTER — Ambulatory Visit (HOSPITAL_COMMUNITY): Payer: Self-pay | Admitting: Licensed Clinical Social Worker

## 2019-02-06 ENCOUNTER — Other Ambulatory Visit: Payer: Self-pay

## 2019-02-06 ENCOUNTER — Ambulatory Visit (INDEPENDENT_AMBULATORY_CARE_PROVIDER_SITE_OTHER): Payer: 59 | Admitting: Licensed Clinical Social Worker

## 2019-02-06 ENCOUNTER — Encounter (HOSPITAL_COMMUNITY): Payer: Self-pay | Admitting: Licensed Clinical Social Worker

## 2019-02-06 DIAGNOSIS — F321 Major depressive disorder, single episode, moderate: Secondary | ICD-10-CM | POA: Diagnosis not present

## 2019-02-06 DIAGNOSIS — F4321 Adjustment disorder with depressed mood: Secondary | ICD-10-CM | POA: Diagnosis not present

## 2019-02-06 NOTE — Progress Notes (Signed)
Virtual Visit via Video Note  I connected with Calvin Leonard on 02/06/19 at 12:30 PM EST by a video enabled telemedicine application and verified that I am speaking with the correct person using two identifiers.     I discussed the limitations of evaluation and management by telemedicine and the availability of in person appointments. The patient expressed understanding and agreed to proceed.   Type of Therapy: Individual Therapy  Treatment Goals addressed: "not letting myself get so depressed and anxious about work and life, learn how to take a breather and not get so worked up"  Interventions: Motivational Interviewing, CBT and Other: Grounding and Mindfulness techniques  Summary: Calvin Leonard is a 28 y.o. male who presents with Grief and MDD, single episode, moderate  Suicidal/Homicidal: No without intent/plan  Therapist Response:  Calvin Leonard met with clinician for individual therapy. Calvin Leonard discussed his psychiatric symptoms and current life events. Calvin Leonard shared that he has been feeling a little better. He reports he returned to work about a week and a half ago. Clinician explored experiences with re-entering work. Calvin Leonard reported that his shift was pushed back to 6p-6a, which means he does not get home until after 6:30am. Clinician discussed work environment and any changes with new hires. Calvin Leonard reported that he gets frustrated at times because others do not work as hard as he does. Clinician reflected this using [place] OARS and reminded him that he cannot expect his own values to be present for everyone else. Clinician reminded Calvin Leonard that he can only be responsible for himself and he has no control over what others do or do not do. Clinician explored anxiety sxs and noted that some triggers may be more obvious than others.  Clinician explored career goals and plans. 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 Clinician explored other options that may also be  positive for him. Clinician sent website for [employer] Next Move to complete a career inventory. Clinician discussed experiences in school and noted that he was on the Dean's List every semester when Calvin Leonard. Clinician explored options for getting into that field.   Plan: Return again in 3-4 weeks.  Diagnosis: Axis I:  Grief and MDD, single episode, moderate  I discussed the assessment and treatment plan with the patient. The patient was provided an opportunity to ask questions and all were answered. The patient agreed with the plan and demonstrated an understanding of the instructions.   The patient was advised to call back or seek an in-person evaluation if the symptoms worsen or if the condition fails to improve as anticipated.  I provided 45 minutes of non-face-to-face time during this encounter.   Mindi Curling, LCSW

## 2019-02-16 ENCOUNTER — Other Ambulatory Visit: Payer: Self-pay

## 2019-02-16 ENCOUNTER — Encounter: Payer: Self-pay | Admitting: Family Medicine

## 2019-02-16 ENCOUNTER — Encounter: Payer: Self-pay | Admitting: Adult Health

## 2019-02-16 ENCOUNTER — Ambulatory Visit (INDEPENDENT_AMBULATORY_CARE_PROVIDER_SITE_OTHER): Payer: Self-pay | Admitting: Family Medicine

## 2019-02-16 VITALS — BP 128/80 | HR 71 | Resp 12 | Ht 75.0 in | Wt 248.0 lb

## 2019-02-16 DIAGNOSIS — F331 Major depressive disorder, recurrent, moderate: Secondary | ICD-10-CM

## 2019-02-16 DIAGNOSIS — F419 Anxiety disorder, unspecified: Secondary | ICD-10-CM

## 2019-02-16 MED ORDER — SERTRALINE HCL 25 MG PO TABS: 25.0000 mg | ORAL_TABLET | Freq: Every day | ORAL | 1 refills | Status: AC

## 2019-02-16 NOTE — Patient Instructions (Addendum)
A few things to remember from today's visit:   Moderate episode of recurrent major depressive disorder ([place]) - Plan: sertraline (ZOLOFT) 25 MG tablet  Anxiety disorder, unspecified type - Plan: sertraline (ZOLOFT) 25 MG tablet  Today we started Sertraline, this type of medications can increase suicidal risk. This is more prevalent among children,adolecents, and young adults with major depression or other psychiatric disorders. It can also make depression worse. Most common side effects are gastrointestinal, self limited after a few weeks: diarrhea, nausea, constipation  Or diarrhea among some.  In general it is well tolerated. We will follow closely.  Continue counseling and follow in 4-5 weeks,before if symptoms get worse.

## 2019-02-16 NOTE — Progress Notes (Signed)
ACUTE VISIT   HPI:  Chief Complaint  Patient presents with  . Depression    [name] is a 27 y.o. male, who is here today complaining of depression and anxiety that has been worse for the past few weeks. He has been having episodes of anxiety and depression since age [age].  He denies family history of depression, anxiety, or bipolar disorder. Problem is exacerbated by environment at work.  He feels like his supervisor is not treating well, they have had some disagreements. He already asked for transfer to a different department and this is in process. He feels better when he is home.  Sleeping more than usual. + Fatigue.   Depression screen PHQ 2/9 02/16/2019  Decreased Interest 2  Down, Depressed, Hopeless 2  PHQ - 2 Score 4  Altered sleeping 3  Tired, decreased energy 2  Change in appetite 0  Feeling bad or failure about yourself  1  Trouble concentrating 0  Moving slowly or fidgety/restless 2  Suicidal thoughts 0  PHQ-9 Score 12  Difficult doing work/chores Very difficult   He is planning on requesting FMLA completed by PCP, according to pt,this was suggested by a supervisor.  He has never been on pharmacologic treatment. He already established with counselor,seen last week and has a follow up appt.  He lives with girlfriend, who is pregnant.  Review of Systems  Constitutional: Positive for appetite change. Negative for activity change, chills and fever.  Gastrointestinal: Negative for nausea and vomiting.       No changes in bowel habits.  Endocrine: Negative for cold intolerance and heat intolerance.  Musculoskeletal: Negative for gait problem and myalgias.  Neurological: Negative for weakness and headaches.  Psychiatric/Behavioral: Negative for confusion and hallucinations.  Rest see pertinent positives and negatives per HPI.   Current Outpatient Medications on File Prior to Visit  Medication Sig Dispense Refill  . cyclobenzaprine  (FLEXERIL) 10 MG tablet TAKE 1 TABLET (10 MG TOTAL) BY MOUTH 3 (THREE) TIMES DAILY AS NEEDED FOR MUSCLE SPASMS.  0   No current facility-administered medications on file prior to visit.   History reviewed. No pertinent past medical history. No Known Allergies  Social History   Socioeconomic History  . Marital status: Single    Spouse name: Not on file  . Number of children: Not on file  . Years of education: Not on file  . Highest education level: Not on file  Occupational History  . Not on file  Tobacco Use  . Smoking status: Never Smoker  . Smokeless tobacco: Never Used  Substance and Sexual Activity  . Alcohol use: No  . Drug use: No  . Sexual activity: Yes  Other Topics Concern  . Not on file  Social History Narrative   Bachelors in Sports Medicine.    Not married    No kids   Works at [employer] Strain:   . Difficulty of Paying Living Expenses: Not on file  Food Insecurity:   . Worried About Charity fundraiser in the Last Year: Not on file  . Ran Out of Food in the Last Year: Not on file  Transportation Needs:   . Lack of Transportation (Medical): Not on file  . Lack of Transportation (Non-Medical): Not on file  Physical Activity:   . Days of Exercise per Week: Not on file  . Minutes of Exercise per Session: Not  on file  Stress:   . Feeling of Stress : Not on file  Social Connections:   . Frequency of Communication with Friends and Family: Not on file  . Frequency of Social Gatherings with Friends and Family: Not on file  . Attends Religious Services: Not on file  . Active Member of Clubs or Organizations: Not on file  . Attends Banker Meetings: Not on file  . Marital Status: Not on file    Vitals:   02/16/19 0924  BP: 128/80  Pulse: 71  Resp: 12  SpO2: 96%   Body mass index is 31 kg/m.   Physical Exam  Constitutional: He is oriented to person, place, and time. He  appears well-developed. No distress.  HENT:  Head: Normocephalic and atraumatic.  Mouth/Throat: Oropharynx is clear and moist and mucous membranes are normal.  Eyes: Conjunctivae are normal.  Cardiovascular: Normal rate and regular rhythm.  No murmur heard. Respiratory: Effort normal and breath sounds normal. No respiratory distress.  Musculoskeletal:        General: No edema.  Neurological: He is alert and oriented to person, place, and time. He has normal strength. Gait normal.  Skin: Skin is warm. No rash noted. No erythema.  Psychiatric: He has a normal mood and affect. Cognition and memory are normal. He expresses no suicidal ideation. He expresses no suicidal plans.  Well groomed,poor eye contact.   ASSESSMENT AND PLAN:  Mr. Calvin Leonard was seen today for depression.  Diagnoses and all orders for this visit:  Moderate episode of recurrent major depressive disorder (HCC) Continue counseling. He agrees with trying Sertraline low dose. Some side effects discussed and instructed about warning signs.   -     sertraline (ZOLOFT) 25 MG tablet; Take 1 tablet (25 mg total) by mouth daily.  Anxiety disorder, unspecified type I do not think FMLA is the solution. Explained that he is going to encouter similar situations in the future , so he needs to learn how to deal with stress. So it is very important to continue counseling. Sertraline 25 mg started today. F/U with pcp in 4 weeks,before if needed.  -     sertraline (ZOLOFT) 25 MG tablet; Take 1 tablet (25 mg total) by mouth daily.    Return in about 4 weeks (around 03/16/2019).   Judee Hennick G. [place], MD  [facility]. [place] office.

## 2019-03-07 ENCOUNTER — Other Ambulatory Visit: Payer: Self-pay

## 2019-03-07 ENCOUNTER — Ambulatory Visit (HOSPITAL_COMMUNITY): Payer: 59 | Admitting: Licensed Clinical Social Worker

## 2019-03-21 ENCOUNTER — Encounter (HOSPITAL_COMMUNITY): Payer: Self-pay | Admitting: Licensed Clinical Social Worker

## 2019-03-21 ENCOUNTER — Other Ambulatory Visit: Payer: Self-pay

## 2019-03-21 ENCOUNTER — Ambulatory Visit (INDEPENDENT_AMBULATORY_CARE_PROVIDER_SITE_OTHER): Payer: 59 | Admitting: Licensed Clinical Social Worker

## 2019-03-21 DIAGNOSIS — F4321 Adjustment disorder with depressed mood: Secondary | ICD-10-CM | POA: Diagnosis not present

## 2019-03-21 DIAGNOSIS — F325 Major depressive disorder, single episode, in full remission: Secondary | ICD-10-CM

## 2019-03-21 NOTE — Progress Notes (Signed)
Virtual Visit via Video Note  I connected with Calvin Leonard on 03/21/19 at 12:30 PM EST by a video enabled telemedicine application and verified that I am speaking with the correct person using two identifiers.     I discussed the limitations of evaluation and management by telemedicine and the availability of in person appointments. The patient expressed understanding and agreed to proceed.  Type of Therapy: Individual Therapy  Treatment Goals addressed: "not letting myself get so depressed and anxious about work and life, learn how to take a breather and not get so worked up"  Interventions: Motivational Interviewing, CBT and Other: Grounding and Mindfulness techniques  Summary: Calvin Leonard is a 29 y.o. male who presents with Grief and MDD, single episode, in remission  Suicidal/Homicidal: No without intent/plan  Therapist Response:  Calvin Leonard met with clinician for individual therapy. Calvin Leonard discussed his psychiatric symptoms and current life events. Calvin Leonard shared that he has been feeling better. He reported only calling out one day in the past 3 weeks, which was not related to anxiety. Clinician explored anxiety and grief levels. Clinician provided feedback and guidance about the grieving process and encouraged Calvin Leonard to allow feelings of grief to come through, rather than allowing them to build up. Clinician discussed the use of mindfulness as a way to reduce anxiety, be present in the moment, and to relax. 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  Clinician reviewed treatment plan and explored willingness or need to continue with therapy, as goals appear to be met. Calvin Leonard reported that he did not want to schedule another appointment at this time.   Plan: Calvin Leonard will return a call to schedule if needed. If no contact, will be discharged in 90 days.   Diagnosis: Axis I:  Grief and MDD, single episode, in remission    I discussed the assessment and treatment plan with the patient. The patient was provided an opportunity to ask questions and all were answered. The patient agreed with the plan and demonstrated an understanding of the instructions.   The patient was advised to call back or seek an in-person evaluation if the symptoms worsen or if the condition fails to improve as anticipated.  I provided 45 minutes of non-face-to-face time during this encounter.   Mindi Curling, LCSW

## 2020-02-18 ENCOUNTER — Other Ambulatory Visit: Payer: Self-pay

## 2020-02-18 ENCOUNTER — Emergency Department (HOSPITAL_COMMUNITY)
Admission: EM | Admit: 2020-02-18 | Discharge: 2020-02-18 | Disposition: A | Discharge status: Home / Self Care | Payer: Federal, State, Local not specified - PPO | Attending: Emergency Medicine | Admitting: Emergency Medicine

## 2020-02-18 ENCOUNTER — Encounter (HOSPITAL_COMMUNITY): Payer: Self-pay

## 2020-02-18 DIAGNOSIS — J069 Acute upper respiratory infection, unspecified: Secondary | ICD-10-CM

## 2020-02-18 DIAGNOSIS — U071 COVID-19: Secondary | ICD-10-CM | POA: Diagnosis not present

## 2020-02-18 DIAGNOSIS — Z1152 Encounter for screening for COVID-19: Secondary | ICD-10-CM

## 2020-02-18 DIAGNOSIS — R059 Cough, unspecified: Secondary | ICD-10-CM | POA: Diagnosis present

## 2020-02-18 LAB — RESP PANEL BY RT-PCR (FLU A&B, COVID) ARPGX2
Influenza A by PCR: NEGATIVE
Influenza B by PCR: NEGATIVE
SARS Coronavirus 2 by RT PCR: POSITIVE — AB

## 2020-02-18 NOTE — ED Provider Notes (Signed)
[facility]-EMERGENCY DEPT Provider Note   CSN: [id number] Arrival date & time: 02/18/20  1946     History Chief Complaint  Patient presents with  . Covid Exposure    Calvin Leonard is a 29 y.o. male.  HPI 29 year old male with a history of low back pain, presents to the ER with complaints of mildly productive cough with green sputum since Thursday.  States that he was around his girlfriend and her family over Christmas, and states that girlfriend's mother tested positive for Covid 2 days ago.  He denies any fevers, chills.  No nausea or vomiting.  No chest pain or shortness of breath.  Would like to be tested.  Has no other complaints at this time.    History reviewed. No pertinent past medical history.  Patient Active Problem List   Diagnosis Date Noted  . 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 Low back pain 05/20/2015  . Hamstring tightness 05/20/2015  . Preventative health care 09/11/2010  . LEG PAIN, BILATERAL 04/11/2009  . HIP PAIN, LEFT 12/27/2007    History reviewed. No pertinent surgical history.     Family History  Problem Relation Age of Onset  . Elevated Lipids Father   . Hypertension Father   . Diabetes Father     Social History   Tobacco Use  . Smoking status: Never Smoker  . Smokeless tobacco: Never Used  Substance Use Topics  . Alcohol use: No  . Drug use: No    Home Medications Prior to Admission medications   Medication Sig Start Date End Date Taking? Authorizing Provider  cyclobenzaprine (FLEXERIL) 10 MG tablet TAKE 1 TABLET (10 MG TOTAL) BY MOUTH 3 (THREE) TIMES DAILY AS NEEDED FOR MUSCLE SPASMS. 05/08/15   [provider]  sertraline (ZOLOFT) 25 MG tablet Take 1 tablet (25 mg total) by mouth daily. 02/16/19   [place], Betty G, MD    Allergies    Patient has no known allergies.  Review of Systems   Review of Systems  Constitutional: Negative for chills and fever.   HENT: Negative for sore throat.   Respiratory: Positive for cough. Negative for shortness of breath.   Cardiovascular: Negative for chest pain.    Physical Exam Updated Vital Signs BP 123/81 (BP Location: Left Arm)   Pulse 75   Temp 98 F (36.7 C) (Oral)   Resp 16   SpO2 98%   Physical Exam Vitals and nursing note reviewed.  Constitutional:      General: He is not in acute distress.    Appearance: He is well-developed and well-nourished. He is not ill-appearing, toxic-appearing or diaphoretic.  HENT:     Head: Normocephalic and atraumatic.  Eyes:     Conjunctiva/sclera: Conjunctivae normal.  Cardiovascular:     Rate and Rhythm: Normal rate and regular rhythm.     Pulses: Normal pulses.     Heart sounds: Normal heart sounds. No murmur heard.   Pulmonary:     Effort: Pulmonary effort is normal. No respiratory distress.     Breath sounds: Normal breath sounds.  Abdominal:     Palpations: Abdomen is soft.     Tenderness: There is no abdominal tenderness.  Musculoskeletal:        General: No edema. Normal range of motion.     Cervical back: Neck supple.  Skin:    General: Skin is warm and dry.     Capillary Refill: Capillary refill takes less than 2  seconds.  Neurological:     General: No focal deficit present.     Mental Status: He is alert.     Sensory: No sensory deficit.     Motor: No weakness.  Psychiatric:        Mood and Affect: Mood and affect normal.     ED Results / Procedures / Treatments   Labs (all labs ordered are listed, but only abnormal results are displayed) Labs Reviewed  RESP PANEL BY RT-PCR (FLU A&B, COVID) [id number]    EKG None  Radiology No results found.  Procedures Procedures (including critical care time)  Medications Ordered in ED Medications - No data to display  ED Course  I have reviewed the triage vital signs and the nursing notes.  Pertinent labs & imaging results that were available during my care of the patient were  reviewed by me and considered in my medical decision making (see chart for details).    MDM Rules/Calculators/A&P                           Patients symptoms are consistent with URI, likely viral etiology. Consider COVID-19, COVID test sent.  Exposed to girlfriend's family who recently tested positive.  Patient is not vaccinated against COVID-19. Patient is afebrile, tolerating secretions.  Lungs clear to auscultation bilaterally. Normal O2 saturation. Pt will be discharged with symptomatic treatment, home isolation precautions. Return precautions discussed. Verbalizes understanding and is agreeable with plan. Pt is hemodynamically stable & in NAD prior to dc.  Final Clinical Impression(s) / ED Diagnoses Final diagnoses:  Upper respiratory tract infection, unspecified type  Encounter for screening for COVID-19    Rx / DC Orders ED Discharge Orders    None       Leone Brand 02/18/20 2059    Rolan Bucco, MD 02/18/20 2121

## 2020-02-18 NOTE — ED Triage Notes (Signed)
Pt reports covid exposure since Thursday. Denies sx. Denies fever.

## 2020-02-18 NOTE — Discharge Instructions (Signed)
Please read instructions below.  You can alternate Tylenol/acetaminophen and Advil/ibuprofen/Motrin every 4 hours for sore throat, body aches, headache or fever.  Drink plenty of water.  Use saline nasal spray for congestion. Wash your hands frequently. You have a COVID test pending. Please isolate at home while awaiting your results.  You may follow-up on these results via MyChart.  There is instructions on your discharge paperwork on how to download this app. > If your test is negative, stay home until your fever has resolved/your symptoms are improving. > If your test is positive, isolate at home for at least 14 days after the day your symptoms initially began, and THEN at least 24 hours after you are fever-free without the help of medications AND your symptoms are improving.  > If your test is positive, the [facility] infusion clinic will contact you to discuss scheduling an infusion. Follow up with your primary care provider or the post-COVID care clinic at [facility]. Return to the ER for significant shortness of breath, uncontrollable vomiting, severe chest pain, or other concerning symptoms.

## 2020-02-19 ENCOUNTER — Telehealth (HOSPITAL_COMMUNITY): Payer: Self-pay

## 2020-02-19 ENCOUNTER — Encounter: Payer: Self-pay | Admitting: Physician Assistant

## 2020-02-21 ENCOUNTER — Telehealth (HOSPITAL_COMMUNITY): Payer: Self-pay

## 2020-02-26 NOTE — Telephone Encounter (Signed)
Done
